# Patient Record
Sex: Male | Born: 1960
Health system: Southern US, Community
[De-identification: ages and names within clinical notes are randomized; demographics above are authoritative.]

## PROBLEM LIST (undated history)

## (undated) DIAGNOSIS — E785 Hyperlipidemia, unspecified: Secondary | ICD-10-CM

## (undated) DIAGNOSIS — T7840XA Allergy, unspecified, initial encounter: Secondary | ICD-10-CM

## (undated) DIAGNOSIS — G4730 Sleep apnea, unspecified: Secondary | ICD-10-CM

## (undated) DIAGNOSIS — R0989 Other specified symptoms and signs involving the circulatory and respiratory systems: Secondary | ICD-10-CM

## (undated) DIAGNOSIS — R0683 Snoring: Secondary | ICD-10-CM

## (undated) HISTORY — DX: Sleep apnea, unspecified: G47.30

## (undated) HISTORY — DX: Hyperlipidemia, unspecified: E78.5

## (undated) HISTORY — DX: Other specified symptoms and signs involving the circulatory and respiratory systems: R09.89

## (undated) HISTORY — PX: NO PAST SURGERIES: SHX2092

## (undated) HISTORY — DX: Snoring: R06.83

## (undated) HISTORY — DX: Allergy, unspecified, initial encounter: T78.40XA

## (undated) HISTORY — PX: OTHER SURGICAL HISTORY: SHX169

---

## 2011-09-29 ENCOUNTER — Telehealth: Payer: Self-pay

## 2011-09-29 NOTE — Telephone Encounter (Signed)
LMOM of cell to CB with pharmacy and medication info.

## 2011-09-29 NOTE — Telephone Encounter (Signed)
Spoke with patients wife, needing allergy med and nasal spray called in.  Last seen 01/2011 and chart not filed back.  Wife is unsure of spray, but he was using Allegra.  Called [facility] to find out, and both the one in [place] and [place] show no record of these meds.  Told to call [facility], and they have no allergy meds on pt.  Can we refill Allegra, and ? Some nasal spray to pharmacy?  Pls advise.  Contact wife at [phone] and send into [facility]

## 2011-09-29 NOTE — Telephone Encounter (Signed)
.  [facility] Patient called to state that he had called Wednesday 09/26/11 regarding his nasal spray refill and that he checked this morning and the pharmacy did not have his Rx ready.  Patient states he was told on 09/26/11 that it would take 24-48 hours for his Rx refill.  Please call patient at [phone]-Patient stated that it was ok for [facility] staff to speak with his wife Gunnar Fusi regarding his medical information.

## 2011-09-30 MED ORDER — AZELASTINE HCL 0.1 % NA SOLN: 2.0000 | Freq: Two times a day (BID) | NASAL | Status: DC

## 2011-09-30 MED ORDER — FEXOFENADINE HCL 180 MG PO TABS: 180.0000 mg | ORAL_TABLET | Freq: Every day | ORAL | Status: DC

## 2011-09-30 MED ORDER — FLUTICASONE PROPIONATE 50 MCG/ACT NA SUSP: 2.0000 | Freq: Every day | NASAL | Status: DC

## 2011-09-30 NOTE — Telephone Encounter (Signed)
Patients wife notified

## 2011-09-30 NOTE — Telephone Encounter (Signed)
Allegra, Astelin and Flonase sent in to pharm.

## 2012-11-04 ENCOUNTER — Telehealth: Payer: Self-pay

## 2012-11-04 NOTE — Telephone Encounter (Signed)
Needs OV, pt has not been seen in epic

## 2012-11-04 NOTE — Telephone Encounter (Signed)
Pt called stating his pharmacy faxed over a request for a refill on his nasal spray, therefore, wanted to know if it has been received. Pt req a call back at [phone].

## 2012-11-04 NOTE — Telephone Encounter (Signed)
This wasn't sent in, can we send it in. Please advise.

## 2012-11-05 ENCOUNTER — Telehealth: Payer: Self-pay | Admitting: Radiology

## 2012-11-05 NOTE — Telephone Encounter (Signed)
Patient advised he needs office visit for med refills. He plans to come in. Last OV 2012

## 2012-11-05 NOTE — Telephone Encounter (Signed)
Left message for patient to come in for OV before getting refills.

## 2013-03-11 ENCOUNTER — Ambulatory Visit (INDEPENDENT_AMBULATORY_CARE_PROVIDER_SITE_OTHER): Payer: BC Managed Care – PPO | Admitting: Family Medicine

## 2013-03-11 VITALS — BP 110/74 | HR 73 | Temp 98.2°F | Resp 18 | Ht 68.0 in | Wt 193.0 lb

## 2013-03-11 DIAGNOSIS — J309 Allergic rhinitis, unspecified: Secondary | ICD-10-CM

## 2013-03-11 DIAGNOSIS — Z0000 Encounter for general adult medical examination without abnormal findings: Secondary | ICD-10-CM

## 2013-03-11 DIAGNOSIS — Z125 Encounter for screening for malignant neoplasm of prostate: Secondary | ICD-10-CM

## 2013-03-11 LAB — POCT CBC
Hemoglobin: 12.8 g/dL — AB (ref 14.1–18.1)
MCH, POC: 29 pg (ref 27–31.2)
MCV: 92.5 fL (ref 80–97)
RBC: 4.42 M/uL — AB (ref 4.69–6.13)
WBC: 7.7 10*3/uL (ref 4.6–10.2)

## 2013-03-11 LAB — COMPREHENSIVE METABOLIC PANEL
ALT: 24 U/L (ref 0–53)
Albumin: 4.8 g/dL (ref 3.5–5.2)
BUN: 16 mg/dL (ref 6–23)
CO2: 23 mEq/L (ref 19–32)
Calcium: 10.1 mg/dL (ref 8.4–10.5)
Chloride: 104 mEq/L (ref 96–112)
Creat: 0.85 mg/dL (ref 0.50–1.35)
Potassium: 4.1 mEq/L (ref 3.5–5.3)

## 2013-03-11 LAB — PSA: PSA: 0.27 ng/mL (ref <=4.00)

## 2013-03-11 LAB — LIPID PANEL
HDL: 57 mg/dL (ref >39)
Triglycerides: 63 mg/dL (ref <150)

## 2013-03-11 MED ORDER — AZELASTINE HCL 0.1 % NA SOLN: 1.0000 | Freq: Two times a day (BID) | NASAL | Status: DC

## 2013-03-11 NOTE — Progress Notes (Signed)
[facility] [address] [place] [address] [phone]  Date:  03/11/2013   Name:  Earl Ruiz   DOB:  Feb 13, 1961   MRN:  [id number]  PCP:  No primary provider on file.    Chief Complaint: Annual Exam   History of Present Illness:  Earl Ruiz is a 52 y.o. very pleasant male patient who presents with the following:  He is here today as a new pt or a CPE and health screening for his job.  He needs a colonoscopy as this has not been done yet.   He did have a PCP here, but we have not seen him in a few years.  He is fasting this morning.  He does have a history of AR and needs a refill of his astelin spray.    He did have a tetanus shot about one year ago through his job.  He will receive his flu shot at his job.    He is married, no children, does not smoke or drink alcohol.     There are no active problems to display for this patient.   Past Medical History  Diagnosis Date  . Allergy     History reviewed. No pertinent past surgical history.  History  Substance Use Topics  . Smoking status: Never Smoker   . Smokeless tobacco: Not on file  . Alcohol Use: No    History reviewed. No pertinent family history.  No Known Allergies  Medication list has been reviewed and updated.  Current Outpatient Prescriptions on File Prior to Visit  Medication Sig Dispense Refill  . azelastine (ASTELIN) 137 MCG/SPRAY nasal spray Place 2 sprays into the nose 2 (two) times daily. Use in each nostril as directed  30 mL  2   No current facility-administered medications on file prior to visit.    Review of Systems:  As per HPI- otherwise negative.   Physical Examination: Filed Vitals:   03/11/13 0910  BP: 110/74  Pulse: 73  Temp: 98.2 F (36.8 C)  Resp: 18   Filed Vitals:   03/11/13 0910  Height: 5\' 8"  (1.727 m)  Weight: 193 lb (87.544 kg)   Body mass index is 29.35 kg/(m^2). Ideal Body Weight: Weight in (lb) to have BMI = 25:  164.1  GEN: WDWN, NAD, Non-toxic, A & O x 3 HEENT: Atraumatic, Normocephalic. Neck supple. No masses, No LAD.  Bilateral TM wnl, oropharynx normal.  PEERL,EOMI.   Ears and Nose: No external deformity. CV: RRR, No M/G/R. No JVD. No thrill. No extra heart sounds. PULM: CTA B, no wheezes, crackles, rhonchi. No retractions. No resp. distress. No accessory muscle use. ABD: S, NT, ND, +BS. No rebound. No HSM. EXTR: No c/c/e NEURO Normal gait.  PSYCH: Normally interactive. Conversant. Not depressed or anxious appearing.  Calm demeanor.  Gu: normal testes and penis, normal prostate   Results for orders placed in visit on 03/11/13  POCT CBC      Result Value Range   WBC 7.7  4.6 - 10.2 K/uL   Lymph, poc 2.1  0.6 - 3.4   POC LYMPH PERCENT 26.9  10 - 50 %L   MID (cbc) 0.5  0 - 0.9   POC MID % 7.1  0 - 12 %M   POC Granulocyte 5.1  2 - 6.9   Granulocyte percent 66.0  37 - 80 %G   RBC 4.42 (*) 4.69 - 6.13 M/uL   Hemoglobin 12.8 (*) 14.1 - 18.1  g/dL   HCT, POC 40.9 (*) 81.1 - 53.7 %   MCV 92.5  80 - 97 fL   MCH, POC 29.0  27 - 31.2 pg   MCHC 31.3 (*) 31.8 - 35.4 g/dL   RDW, POC 91.4     Platelet Count, POC 349  142 - 424 K/uL   MPV 8.0  0 - 99.8 fL    Assessment and Plan: Physical exam - Plan: POCT CBC, Comprehensive metabolic panel, Lipid panel  Allergic rhinitis - Plan: azelastine (ASTELIN) 137 MCG/SPRAY nasal spray  Screening for prostate cancer - Plan: PSA  Normal PE except he is slightly anemic today.  Encouraged to schedule his colonoscopy asap.   He receives his annual flu shot at work.   Will plan further follow- up pending labs.   Signed Abbe Amsterdam, MD

## 2013-03-11 NOTE — Patient Instructions (Addendum)
I will be in touch with the rest of your labs so you can include your cholesterol, etc on your health form  Remember to get your flu shot this year!   Go ahead and call the gastroenterologist of your choice ([facility]) and ask to schedule a screening colonoscopy.  This is especially important because you are slightly anemic.

## 2013-03-12 ENCOUNTER — Encounter: Payer: Self-pay | Admitting: Family Medicine

## 2013-03-19 ENCOUNTER — Telehealth: Payer: Self-pay

## 2013-03-19 DIAGNOSIS — Z1211 Encounter for screening for malignant neoplasm of colon: Secondary | ICD-10-CM

## 2013-03-19 DIAGNOSIS — E7800 Pure hypercholesterolemia, unspecified: Secondary | ICD-10-CM

## 2013-03-19 NOTE — Telephone Encounter (Signed)
Called and discussed with him.  He is concerned about his cholesterol.  He would like to give lifestyle changes a try prior to going on medication (it sounds like he was on lipitor at some point in the past).  He will work on diet, exercise and weight loss and will come by for a lab visit only in 2 or 3 months,

## 2013-03-19 NOTE — Telephone Encounter (Signed)
Patient states that he needs the numbers for his glucose levels in order to complete his PE form on MyChart. Also states he needs a referral for a colonoscopy. Patient also states his cholesterol is high; would like medication for that.  [phone].

## 2013-03-19 NOTE — Telephone Encounter (Signed)
Glucose 94. Where is he having the Colonoscopy? He wants referral. Sent referral to [facility] for GI appt, since he wants [place] to do this. He wants to know if you want him to start on medication, he has not gotten the letter you sent yet, he is agreeable to start meds if you want him to. Please advise

## 2013-03-26 ENCOUNTER — Telehealth: Payer: Self-pay | Admitting: Radiology

## 2013-03-26 NOTE — Telephone Encounter (Signed)
Patient calling about colonoscopy. Referral was sent to [facility]. [phone] left message for him to advise it was sent and he can call and check on this.

## 2013-03-30 ENCOUNTER — Encounter: Payer: Self-pay | Admitting: Internal Medicine

## 2013-05-21 ENCOUNTER — Other Ambulatory Visit: Payer: Self-pay

## 2013-05-26 ENCOUNTER — Ambulatory Visit (AMBULATORY_SURGERY_CENTER): Payer: Self-pay | Admitting: *Deleted

## 2013-05-26 VITALS — Ht 68.0 in | Wt 199.6 lb

## 2013-05-26 DIAGNOSIS — Z1211 Encounter for screening for malignant neoplasm of colon: Secondary | ICD-10-CM

## 2013-05-26 MED ORDER — MOVIPREP 100 G PO SOLR: ORAL | Status: DC

## 2013-05-26 NOTE — Progress Notes (Signed)
No allergies to eggs or soy. No problems with anesthesia.  

## 2013-05-29 ENCOUNTER — Encounter: Payer: Self-pay | Admitting: Internal Medicine

## 2013-06-09 ENCOUNTER — Encounter: Payer: Self-pay | Admitting: Internal Medicine

## 2013-06-09 ENCOUNTER — Ambulatory Visit (AMBULATORY_SURGERY_CENTER): Payer: BC Managed Care – PPO | Admitting: Internal Medicine

## 2013-06-09 VITALS — BP 138/82 | HR 67 | Temp 97.9°F | Resp 25 | Ht 68.0 in | Wt 199.0 lb

## 2013-06-09 DIAGNOSIS — D126 Benign neoplasm of colon, unspecified: Secondary | ICD-10-CM

## 2013-06-09 DIAGNOSIS — Z1211 Encounter for screening for malignant neoplasm of colon: Secondary | ICD-10-CM

## 2013-06-09 MED ORDER — SODIUM CHLORIDE 0.9 % IV SOLN: 500.0000 mL | INTRAVENOUS | Status: DC

## 2013-06-09 NOTE — Progress Notes (Signed)
Called to room to assist during endoscopic procedure.  Patient ID and intended procedure confirmed with present staff. Received instructions for my participation in the procedure from the performing physician.  

## 2013-06-09 NOTE — Op Note (Signed)
[facility] [address] [place], [address]   COLONOSCOPY PROCEDURE REPORT  PATIENT: Earl Ruiz, Earl Ruiz  MR#: [id number] BIRTHDATE: 08/27/1960 , 52  yrs. old GENDER: Male ENDOSCOPIST: Beverley Fiedler, MD REFERRED QI:ONGEXBM Copland, M.D. PROCEDURE DATE:  06/09/2013 PROCEDURE:   Colonoscopy with snare polypectomy First Screening Colonoscopy - Avg.  risk and is 50 yrs.  old or older Yes.  Prior Negative Screening - Now for repeat screening. N/A  History of Adenoma - Now for follow-up colonoscopy & has been > or = to 3 yrs.  N/A  Polyps Removed Today? Yes. ASA CLASS:   Class I INDICATIONS:average risk screening and first colonoscopy. MEDICATIONS: MAC sedation, administered by CRNA and propofol (Diprivan) 300mg  IV  DESCRIPTION OF PROCEDURE:   After the risks benefits and alternatives of the procedure were thoroughly explained, informed consent was obtained.  A digital rectal exam revealed no rectal mass.   The LB [id number]  endoscope was introduced through the anus and advanced to the cecum, which was identified by both the appendix and ileocecal valve. No adverse events experienced. The quality of the prep was good, using MoviPrep  The instrument was then slowly withdrawn as the colon was fully examined.  COLON FINDINGS: Two sessile polyps measuring 5 and 2 mm in size were found in the sigmoid colon and rectum.  Polypectomy was performed using cold snare.  All resections were complete and all polyp tissue was completely retrieved.   Mild diverticulosis was noted in the sigmoid colon.  Retroflexed views revealed small external hemorrhoids. The time to cecum=1 minutes 44 seconds.  Withdrawal time=12 minutes 53 seconds.  The scope was withdrawn and the procedure completed. COMPLICATIONS: There were no complications.  ENDOSCOPIC IMPRESSION: 1.   Two sessile polyps measuring 5 and 2 mm in size were found in the sigmoid colon and rectum; Polypectomy was performed  using cold snare 2.   Mild diverticulosis was noted in the sigmoid colon  RECOMMENDATIONS: 1.  Await pathology results 2.  High fiber diet 3.  If the polyps removed today are proven to be adenomatous (pre-cancerous) polyps, you will need a repeat colonoscopy in 5 years.  Otherwise you should continue to follow colorectal cancer screening guidelines for "routine risk" patients with colonoscopy in 10 years.  You will receive a letter within 1-2 weeks with the results of your biopsy as well as final recommendations.  Please call my office if you have not received a letter after 3 weeks.   eSigned:  Beverley Fiedler, MD 06/09/2013 9:58 AM cc: The Patient and Abbe Amsterdam, MD

## 2013-06-09 NOTE — Patient Instructions (Addendum)
YOU HAD AN ENDOSCOPIC PROCEDURE TODAY AT THE [facility]: Refer to the procedure report that was given to you for any specific questions about what was found during the examination.  If the procedure report does not answer your questions, please call your gastroenterologist to clarify.  If you requested that your care partner not be given the details of your procedure findings, then the procedure report has been included in a sealed envelope for you to review at your convenience later.  YOU SHOULD EXPECT: Some feelings of bloating in the abdomen. Passage of more gas than usual.  Walking can help get rid of the air that was put into your GI tract during the procedure and reduce the bloating. If you had a lower endoscopy (such as a colonoscopy or flexible sigmoidoscopy) you may notice spotting of blood in your stool or on the toilet paper. If you underwent a bowel prep for your procedure, then you may not have a normal bowel movement for a few days.  DIET: Your first meal following the procedure should be a light meal and then it is ok to progress to your normal diet.  A half-sandwich or bowl of soup is an example of a good first meal.  Heavy or fried foods are harder to digest and may make you feel nauseous or bloated.  Likewise meals heavy in dairy and vegetables can cause extra gas to form and this can also increase the bloating.  Drink plenty of fluids but you should avoid alcoholic beverages for 24 hours.  ACTIVITY: Your care partner should take you home directly after the procedure.  You should plan to take it easy, moving slowly for the rest of the day.  You can resume normal activity the day after the procedure however you should NOT DRIVE or use heavy machinery for 24 hours (because of the sedation medicines used during the test).    SYMPTOMS TO REPORT IMMEDIATELY: A gastroenterologist can be reached at any hour.  During normal business hours, 8:30 AM to 5:00 PM Monday through Friday,  call [phone].  After hours and on weekends, please call the GI answering service at [phone] who will take a message and have the physician on call contact you.   Following lower endoscopy (colonoscopy or flexible sigmoidoscopy):  Excessive amounts of blood in the stool  Significant tenderness or worsening of abdominal pains  Swelling of the abdomen that is new, acute  Fever of 100F or higher    FOLLOW UP: If any biopsies were taken you will be contacted by phone or by letter within the next 1-3 weeks.  Call your gastroenterologist if you have not heard about the biopsies in 3 weeks.  Our staff will call the home number listed on your records the next business day following your procedure to check on you and address any questions or concerns that you may have at that time regarding the information given to you following your procedure. This is a courtesy call and so if there is no answer at the home number and we have not heard from you through the emergency physician on call, we will assume that you have returned to your regular daily activities without incident.  SIGNATURES/CONFIDENTIALITY: You and/or your care partner have signed paperwork which will be entered into your electronic medical record.  These signatures attest to the fact that that the information above on your After Visit Summary has been reviewed and is understood.  Full responsibility of the confidentiality   of this discharge information lies with you and/or your care-partner.    INFORMATION ON DIVERTICULOSIS POLYPS & HEMORRHOIDS GIVEN TO YOU TODAY

## 2013-06-09 NOTE — Progress Notes (Signed)
No egg or soy allergy. ewm Pt with no prior surgeries or sedation. ewm

## 2013-06-10 ENCOUNTER — Telehealth: Payer: Self-pay | Admitting: *Deleted

## 2013-06-10 NOTE — Telephone Encounter (Signed)
  Follow up Call-  Call back number 06/09/2013  Post procedure Call Back phone  # [phone]  Permission to leave phone message Yes     Patient questions:  Do you have a fever, pain , or abdominal swelling? no Pain Score  0 *  Have you tolerated food without any problems? yes  Have you been able to return to your normal activities? yes  Do you have any questions about your discharge instructions: Diet   no Medications  no Follow up visit  no  Do you have questions or concerns about your Care? no  Actions: * If pain score is 4 or above: No action needed, pain <4.

## 2013-06-16 ENCOUNTER — Encounter: Payer: Self-pay | Admitting: Internal Medicine

## 2013-07-27 ENCOUNTER — Telehealth: Payer: Self-pay | Admitting: Internal Medicine

## 2013-07-27 NOTE — Telephone Encounter (Signed)
Contacted by patient's medical insurance regarding a claim for the colonoscopy performed on 06/09/2013, specifically the anesthesia charge associated with colonoscopy The question was raised as to why he was sedated with propofol   I explained that the patient was sent direct to colonoscopy by his primary care provider for screening colonoscopy He was sedated with propofol as is my first line sedation medication and standard of care in our outpatient endoscopy center.  All patient's for endoscopic procedure receive propofol unless there is an allergy or other contraindication The documentation for this procedure was completed on 06/09/2013 and is available for review if necessary

## 2014-04-12 ENCOUNTER — Ambulatory Visit (INDEPENDENT_AMBULATORY_CARE_PROVIDER_SITE_OTHER): Payer: BC Managed Care – PPO | Admitting: Family Medicine

## 2014-04-12 VITALS — BP 128/90 | HR 73 | Temp 98.2°F | Resp 16 | Ht 67.5 in | Wt 203.8 lb

## 2014-04-12 DIAGNOSIS — Z130 Encounter for screening for diseases of the blood and blood-forming organs and certain disorders involving the immune mechanism: Secondary | ICD-10-CM

## 2014-04-12 DIAGNOSIS — J3089 Other allergic rhinitis: Secondary | ICD-10-CM

## 2014-04-12 DIAGNOSIS — Z23 Encounter for immunization: Secondary | ICD-10-CM

## 2014-04-12 DIAGNOSIS — Z13220 Encounter for screening for lipoid disorders: Secondary | ICD-10-CM

## 2014-04-12 DIAGNOSIS — Z131 Encounter for screening for diabetes mellitus: Secondary | ICD-10-CM

## 2014-04-12 DIAGNOSIS — Z125 Encounter for screening for malignant neoplasm of prostate: Secondary | ICD-10-CM

## 2014-04-12 DIAGNOSIS — G4733 Obstructive sleep apnea (adult) (pediatric): Secondary | ICD-10-CM

## 2014-04-12 MED ORDER — IPRATROPIUM BROMIDE 0.03 % NA SOLN: 2.0000 | Freq: Four times a day (QID) | NASAL | Status: DC

## 2014-04-12 MED ORDER — AZELASTINE HCL 0.1 % NA SOLN: 1.0000 | Freq: Two times a day (BID) | NASAL | Status: DC

## 2014-04-12 NOTE — Progress Notes (Signed)
[facility] [address], [place] [address] [phone]  Date:  04/12/2014   Name:  Earl Ruiz   DOB:  [birth year]   MRN:  [id number]  PCP:  Lamar Blinks, MD    Chief Complaint: Annual Exam   History of Present Illness:  Earl Ruiz is a 53 y.o. very pleasant male patient who presents with the following:  He is here today for a CPE.  He is generally quite healthy and active.   He would like a flu shot today.   He has noted a couple of episodes where he would wake up suddenly and felt like he was SOB.  This does not happen a lot but he is concerned that he might have OSA.  His wife has noted some snoring as well.    He does not have any CP or have any SOB . He does exercise 2x a week, and plans to increase to 4x a week.  He is doing "insanity" work- outs.   He is fasting today for labs.    tdap 8 years ago- he would like to have a booster today as he did cut his finger a couple of weeks ago  Also desires a Flu shot today No issues with prostate enlargement or glaucoma  There are no active problems to display for this patient.   Past Medical History  Diagnosis Date  . Allergy     Past Surgical History  Procedure Laterality Date  . No prior surgery      History  Substance Use Topics  . Smoking status: Never Smoker   . Smokeless tobacco: Never Used  . Alcohol Use: No    Family History  Problem Relation Age of Onset  . Colon cancer Neg Hx   . Diabetes Father     No Known Allergies  Medication list has been reviewed and updated.  Current Outpatient Prescriptions on File Prior to Visit  Medication Sig Dispense Refill  . azelastine (ASTELIN) 137 MCG/SPRAY nasal spray Place 1 spray into the nose 2 (two) times daily. Use in each nostril as directed  30 mL  12   No current facility-administered medications on file prior to visit.    Review of Systems:  As per HPI- otherwise negative.   Physical Examination: Filed Vitals:   04/12/14 1351  BP: 128/90  Pulse: 73  Temp: 98.2 F (36.8 C)  Resp: 16   Filed Vitals:   04/12/14 1351  Height: 5' 7.5" (1.715 m)  Weight: 203 lb 12.8 oz (92.443 kg)   Body mass index is 31.43 kg/(m^2). Ideal Body Weight: Weight in (lb) to have BMI = 25: 161.7  GEN: WDWN, NAD, Non-toxic, A & O x 3, looks well, fit build HEENT: Atraumatic, Normocephalic. Neck supple. No masses, No LAD.  Bilateral TM wnl, oropharynx normal.  PEERL,EOMI.   Ears and Nose: No external deformity. CV: RRR, No M/G/R. No JVD. No thrill. No extra heart sounds. PULM: CTA B, no wheezes, crackles, rhonchi. No retractions. No resp. distress. No accessory muscle use. ABD: S, NT, ND. No rebound. No HSM. EXTR: No c/c/e NEURO Normal gait.  PSYCH: Normally interactive. Conversant. Not depressed or anxious appearing.  Calm demeanor.  GU: normal scrotal/ testicular/ penile exam, normal DRE  Assessment and Plan: Need for prophylactic vaccination and inoculation against influenza - Plan: Flu Vaccine QUAD 36+ mos IM, Td vaccine greater than or equal to 7yo preservative free IM  Screening for prostate cancer - Plan: PSA  Sleep apnea, obstructive - Plan: Nocturnal polysomnography (NPSG), ipratropium (ATROVENT) 0.03 % nasal spray  Screening for diabetes mellitus - Plan: Comprehensive metabolic panel  Screening for hyperlipidemia - Plan: Lipid panel  Screening for deficiency anemia - Plan: CBC  Generally well man here today for a CPE.  Immunizations as above, await labs  Will refer for a sleep study as he is concerned about OSA   Signed Lamar Blinks, MD

## 2014-04-12 NOTE — Patient Instructions (Signed)
We will set you up for a sleep study to make sure you do not have sleep apnea. You can use the atrovent nasal spray for mucus/ Post- nasal drip as needed.    I will be in touch with your labs

## 2014-04-13 ENCOUNTER — Encounter: Payer: Self-pay | Admitting: Family Medicine

## 2014-04-13 LAB — COMPREHENSIVE METABOLIC PANEL
ALT: 21 U/L (ref 0–53)
AST: 18 U/L (ref 0–37)
Albumin: 4.7 g/dL (ref 3.5–5.2)
Alkaline Phosphatase: 68 U/L (ref 39–117)
BILIRUBIN TOTAL: 0.5 mg/dL (ref 0.2–1.2)
BUN: 9 mg/dL (ref 6–23)
CALCIUM: 9.7 mg/dL (ref 8.4–10.5)
CHLORIDE: 101 meq/L (ref 96–112)
CO2: 27 mEq/L (ref 19–32)
CREATININE: 0.81 mg/dL (ref 0.50–1.35)
Glucose, Bld: 87 mg/dL (ref 70–99)
Potassium: 4.1 mEq/L (ref 3.5–5.3)
Sodium: 137 mEq/L (ref 135–145)
Total Protein: 7.9 g/dL (ref 6.0–8.3)

## 2014-04-13 LAB — CBC
HCT: 41.8 % (ref 39.0–52.0)
Hemoglobin: 13.7 g/dL (ref 13.0–17.0)
MCH: 28.8 pg (ref 26.0–34.0)
MCHC: 32.8 g/dL (ref 30.0–36.0)
MCV: 88 fL (ref 78.0–100.0)
Platelets: 392 10*3/uL (ref 150–400)
RBC: 4.75 MIL/uL (ref 4.22–5.81)
RDW: 13.6 % (ref 11.5–15.5)
WBC: 10.4 10*3/uL (ref 4.0–10.5)

## 2014-04-13 LAB — LIPID PANEL
CHOL/HDL RATIO: 4.5 ratio
CHOLESTEROL: 217 mg/dL — AB (ref 0–200)
HDL: 48 mg/dL (ref >39)
LDL Cholesterol: 152 mg/dL — ABNORMAL HIGH (ref 0–99)
Triglycerides: 84 mg/dL (ref <150)
VLDL: 17 mg/dL (ref 0–40)

## 2014-04-13 LAB — PSA: PSA: 0.3 ng/mL (ref <=4.00)

## 2014-04-14 ENCOUNTER — Other Ambulatory Visit: Payer: Self-pay | Admitting: Radiology

## 2014-04-14 DIAGNOSIS — G4733 Obstructive sleep apnea (adult) (pediatric): Secondary | ICD-10-CM

## 2014-04-14 NOTE — Addendum Note (Signed)
Addended byCandice Camp on: 04/14/2014 10:52 AM   Modules accepted: Orders

## 2014-05-05 ENCOUNTER — Institutional Professional Consult (permissible substitution): Payer: BC Managed Care – PPO | Admitting: Neurology

## 2014-05-10 ENCOUNTER — Ambulatory Visit (INDEPENDENT_AMBULATORY_CARE_PROVIDER_SITE_OTHER): Payer: BC Managed Care – PPO | Admitting: Neurology

## 2014-05-10 ENCOUNTER — Encounter: Payer: Self-pay | Admitting: Neurology

## 2014-05-10 VITALS — BP 128/89 | HR 84 | Temp 97.2°F | Resp 14 | Ht 68.0 in | Wt 207.0 lb

## 2014-05-10 DIAGNOSIS — J302 Other seasonal allergic rhinitis: Secondary | ICD-10-CM

## 2014-05-10 DIAGNOSIS — R0989 Other specified symptoms and signs involving the circulatory and respiratory systems: Secondary | ICD-10-CM

## 2014-05-10 DIAGNOSIS — R0689 Other abnormalities of breathing: Secondary | ICD-10-CM

## 2014-05-10 DIAGNOSIS — G4730 Sleep apnea, unspecified: Secondary | ICD-10-CM

## 2014-05-10 DIAGNOSIS — R0683 Snoring: Secondary | ICD-10-CM

## 2014-05-10 HISTORY — DX: Other specified symptoms and signs involving the circulatory and respiratory systems: R09.89

## 2014-05-10 HISTORY — DX: Sleep apnea, unspecified: G47.30

## 2014-05-10 HISTORY — DX: Snoring: R06.83

## 2014-05-10 MED ORDER — FEXOFENADINE HCL 30 MG PO TABS
30.0000 mg | ORAL_TABLET | Freq: Two times a day (BID) | ORAL | Status: DC
Start: 2014-05-10 — End: 2018-09-03

## 2014-05-10 MED ORDER — FLUTICASONE PROPIONATE 50 MCG/ACT NA SUSP: 2.0000 | Freq: Every day | NASAL | Status: DC

## 2014-05-10 NOTE — Progress Notes (Signed)
SLEEP MEDICINE CLINIC   Provider:  Larey Seat, M D  Referring Provider: Darreld Mclean, MD Primary Care Physician:  Lamar Blinks, MD  Chief Complaint  Patient presents with  . NP Sleep    Rm 11, wife    HPI:  Earl Ruiz is a 53 y.o. male , who is seen here as a referral from Dr. Lorelei Pont for a sleep consultation,  [name] reports that he is snoring at night and his wife has witnessed this, and that he feels sometimes short of breath while sleeping he describes his sleep choking sensation. His wife also has witnessed him gasping for breath.  About 2 years ago he changed a lot of his diet, he was successful in losing weight and controlling his cholesterolemia and he  regularly works out.  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    He reports neither sleepiness, nor fatigue. He goes to bed at 10.30  PM and sleeps soon after, sleeps through the night,  unless he has choking or louder snoring episodes. He has a one time nocturia, sometimes 2. His wife reports he snores on his back and on his side. Not every night. He rises 4 days a week before 7 AM, he wakes with an alarm.  On weekends 3 AM , as he works another second job on weekends. He rarely dreams.   The bedroom is quiet and dark, and cool. He has allergic seasonal rhinitis. He has no known neck injuries, facial injuries or ENT surgery.    Review of Systems: Out of a complete 14 system review, the patient complains of only the following symptoms, and all other reviewed systems are negative. Sleep choking episodes, snoring ,    Epworth score 8 , Fatigue severity score 21  , depression score n/a , will need PHQ 9 in lab.    History   Social History  . Marital Status: Unknown    Spouse Name: N/A    Number of Children: N/A  . Years of Education: N/A   Occupational History  . Not on file.   Social History Main Topics  . Smoking status: Never Smoker   . Smokeless  tobacco: Never Used  . Alcohol Use: No     Comment: occ  . Drug Use: No  . Sexual Activity: Not on file   Other Topics Concern  . Not on file   Social History Narrative   Right handed.  Caffeine 2 cups daily, Married, no kids.  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     Family History  Problem Relation Age of Onset  . Colon cancer Neg Hx   . Diabetes Father     Past Medical History  Diagnosis Date  . Allergy     Past Surgical History  Procedure Laterality Date  . No prior surgery      Current Outpatient Prescriptions  Medication Sig Dispense Refill  . azelastine (ASTELIN) 0.1 % nasal spray Place 1 spray into both nostrils 2 (two) times daily. Use in each nostril as directed  30 mL  12  . ipratropium (ATROVENT) 0.03 % nasal spray Place 2 sprays into the nose 4 (four) times daily.  30 mL  6  . fexofenadine (ALLEGRA) 30 MG tablet Take 1 tablet (30 mg total) by mouth 2 (two) times daily.  60 tablet  3  . fluticasone (FLONASE) 50 MCG/ACT nasal spray Place 2 sprays into both nostrils daily.  16 g  2   No current facility-administered medications for this visit.    Allergies as of 05/10/2014  . (No Known Allergies)    Vitals: BP 128/89  Pulse 84  Temp(Src) 97.2 F (36.2 C) (Oral)  Resp 14  Ht 5\' 8"  (1.727 m)  Wt 207 lb (93.895 kg)  BMI 31.48 kg/m2 Last Weight:  Wt Readings from Last 1 Encounters:  05/10/14 207 lb (93.895 kg)       Last Height:   Ht Readings from Last 1 Encounters:  05/10/14 5\' 8"  (1.727 m)    Physical exam:  General: The patient is awake, alert and appears not in acute distress. The patient is well groomed. Head: Normocephalic, atraumatic. Neck is supple. Mallampati 4 - uvula not visible. ,  neck circumference: 16.25 . Nasal airflow restricted , TMJ is not evident . Retrognathia is not seen.  Cardiovascular:  Regular rate and rhythm , without  murmurs or carotid bruit, and without distended neck veins. Respiratory: Lungs are clear to auscultation. Skin:   Without evidence of edema, or rash Trunk:  patient has normal posture. Neurologic exam : The patient is awake and alert, oriented to place and time.   Memory subjective described as intact. There is a normal attention span & concentration ability.  Speech is fluent without dysarthria, dysphonia or aphasia. Mood and affect are appropriate. Cranial nerves: Pupils are equal and briskly reactive to light. Funduscopic exam without evidence of pallor or edema.  Extraocular movements  in vertical and horizontal planes intact and without nystagmus. Visual fields by finger perimetry are intact. Hearing to finger rub intact.  Facial sensation intact to fine touch. Facial motor strength is symmetric and tongue and uvula move midline. Motor exam: Normal tone, muscle bulk and symmetric strength in all extremities. Sensory:  Fine touch, pinprick and vibration were tested in all extremities. Proprioception is tested in the upper extremities only.  This was normal. Coordination: Rapid alternating movements in the fingers/hands is normal. Finger-to-nose maneuver  normal without evidence of ataxia, dysmetria or tremor. Gait and station: Patient walks without assistive device and is able unassisted to climb up to the exam table. Strength within normal limits. Stance is stable and normal. Deep tendon reflexes: in the  upper and lower extremities are symmetric and intact. Babinski maneuver response is downgoing.  Assessment:  After physical and neurologic examination, review of laboratory studies, imaging, neurophysiology testing and pre-existing records, assessment is:  1)   This patient has few risk d factors for OSA but his witnessed choking and crescendo snoring make a formal event necessary.  2)   Allergic rhinitis seasonal. Daly fexofenadine in allergy season and nasocort when needed.   Plan:  Treatment plan and additional workup : Ordered a SPLIT at 3 % , AHI 10,    The patient was advised of the nature  of the diagnosed sleep disorder , the treatment options and risks for general a health and wellness arising from not treating the condition. Visit duration was 30  minutes.        Asencion Partridge Natallia Stellmach MD  05/10/2014

## 2014-05-10 NOTE — Patient Instructions (Signed)
Polysomnography (Sleep Studies) Polysomnography (PSG) is a series of tests used for detecting (diagnosing) obstructive sleep apnea and other sleep disorders. The tests measure how some parts of your body are working while you are sleeping. The tests are extensive and expensive. They are done in a sleep lab or hospital, and vary from center to center. Your caregiver may perform other more simple sleep studies and questionnaires before doing more complete and involved testing. Testing may not be covered by insurance. Some of these tests are:  An EEG (Electroencephalogram). This tests your brain waves and stages of sleep.  An EOG (Electrooculogram). This measures the movements of your eyes. It detects periods of REM (rapid eye movement) sleep, which is your dream sleep.  An EKG (Electrocardiogram). This measures your heart rhythm.  EMG (Electromyography). This is a measurement of how the muscles are working in your upper airway and your legs while sleeping.  An oximetry measurement. It measures how much oxygen (air) you are getting while sleeping.  Breathing efforts may be measured. The same test can be interpreted (understood) differently by different caregivers and centers that study sleep.  Studies may be given an apnea/hypopnea index (AHI). This is a number which is found by counting the times of no breathing or under breathing during the night, and relating those numbers to the amount of time spent in bed. When the AHI is greater than 15, the patient is likely to complain of daytime sleepiness. When the AHI is greater than 30, the patient is at increased risk for heart problems and must be followed more closely. Following the AHI also allows you to know how treatment is working. Simple oximetry (tracking the amount of oxygen that is taken in) can be used for screening patients who:  Do not have symptoms (problems) of OSA.  Have a normal Epworth Sleepiness Scale Score.  Have a low pre-test  probability of having OSA.  Have none of the upper airway problems likely to cause apnea.  Oximetry is also used to determine if treatment is effective in patients who showed significant desaturations (not getting enough oxygen) on their home sleep study. One extra measure of safety is to perform additional studies for the person who only snores. This is because no one can predict with absolute certainty who will have OSA. Those who show significant desaturations (not getting enough oxygen) are recommended to have a more detailed sleep study. Document Released: 01/06/2003 Document Revised: 09/24/2011 Document Reviewed: 09/07/2013 ExitCare Patient Information 2015 [employer]. This information is not intended to replace advice given to you by your health care provider. Make sure you discuss any questions you have with your health care provider.  

## 2014-06-07 ENCOUNTER — Other Ambulatory Visit: Payer: Self-pay | Admitting: Neurology

## 2014-06-07 DIAGNOSIS — G4733 Obstructive sleep apnea (adult) (pediatric): Secondary | ICD-10-CM

## 2014-06-16 ENCOUNTER — Ambulatory Visit (INDEPENDENT_AMBULATORY_CARE_PROVIDER_SITE_OTHER): Payer: BC Managed Care – PPO | Admitting: Neurology

## 2014-06-16 DIAGNOSIS — G4733 Obstructive sleep apnea (adult) (pediatric): Secondary | ICD-10-CM

## 2014-06-17 NOTE — Sleep Study (Signed)
Please see the scanned sleep study interpretation located in the Procedure tab within the Chart Review Section 

## 2014-06-28 ENCOUNTER — Telehealth: Payer: Self-pay | Admitting: Neurology

## 2014-06-28 ENCOUNTER — Other Ambulatory Visit: Payer: Self-pay | Admitting: Neurology

## 2014-06-28 DIAGNOSIS — G4733 Obstructive sleep apnea (adult) (pediatric): Secondary | ICD-10-CM

## 2014-06-28 NOTE — Telephone Encounter (Signed)
Patient was contacted to discuss the results from his recent sleep study.  He is aware that he was found to have severe obstructive sleep apnea in which CPAP was initiated during his sleep study. He is aware that an order for him to begin CPAP therapy has been sent to [facility] for processing.  A copy of his sleep study report will be routed to the referring provider and the patient will receive his report via mail as requested.

## 2015-08-05 ENCOUNTER — Encounter: Payer: Self-pay | Admitting: Family Medicine

## 2015-08-10 ENCOUNTER — Encounter: Payer: Self-pay | Admitting: Family Medicine

## 2015-08-13 ENCOUNTER — Other Ambulatory Visit: Payer: Self-pay | Admitting: Family Medicine

## 2015-08-15 ENCOUNTER — Other Ambulatory Visit: Payer: Self-pay | Admitting: Family Medicine

## 2015-08-24 ENCOUNTER — Ambulatory Visit (INDEPENDENT_AMBULATORY_CARE_PROVIDER_SITE_OTHER): Payer: BLUE CROSS/BLUE SHIELD | Admitting: Family Medicine

## 2015-08-24 VITALS — BP 124/78 | HR 94 | Temp 99.3°F | Resp 16 | Ht 67.5 in | Wt 189.0 lb

## 2015-08-24 DIAGNOSIS — Z23 Encounter for immunization: Secondary | ICD-10-CM

## 2015-08-24 DIAGNOSIS — Z119 Encounter for screening for infectious and parasitic diseases, unspecified: Secondary | ICD-10-CM | POA: Diagnosis not present

## 2015-08-24 DIAGNOSIS — J3089 Other allergic rhinitis: Secondary | ICD-10-CM | POA: Diagnosis not present

## 2015-08-24 DIAGNOSIS — Z0000 Encounter for general adult medical examination without abnormal findings: Secondary | ICD-10-CM | POA: Diagnosis not present

## 2015-08-24 DIAGNOSIS — Z125 Encounter for screening for malignant neoplasm of prostate: Secondary | ICD-10-CM

## 2015-08-24 DIAGNOSIS — Z130 Encounter for screening for diseases of the blood and blood-forming organs and certain disorders involving the immune mechanism: Secondary | ICD-10-CM | POA: Diagnosis not present

## 2015-08-24 DIAGNOSIS — Z13220 Encounter for screening for lipoid disorders: Secondary | ICD-10-CM | POA: Diagnosis not present

## 2015-08-24 DIAGNOSIS — Z131 Encounter for screening for diabetes mellitus: Secondary | ICD-10-CM | POA: Diagnosis not present

## 2015-08-24 LAB — CBC
HEMATOCRIT: 42.6 % (ref 39.0–52.0)
Hemoglobin: 14.4 g/dL (ref 13.0–17.0)
MCH: 29.4 pg (ref 26.0–34.0)
MCHC: 33.8 g/dL (ref 30.0–36.0)
MCV: 87.1 fL (ref 78.0–100.0)
MPV: 8.9 fL (ref 8.6–12.4)
Platelets: 407 10*3/uL — ABNORMAL HIGH (ref 150–400)
RBC: 4.89 MIL/uL (ref 4.22–5.81)
RDW: 13.5 % (ref 11.5–15.5)
WBC: 8.7 10*3/uL (ref 4.0–10.5)

## 2015-08-24 LAB — COMPREHENSIVE METABOLIC PANEL
ALK PHOS: 58 U/L (ref 40–115)
ALT: 16 U/L (ref 9–46)
AST: 18 U/L (ref 10–35)
Albumin: 4.5 g/dL (ref 3.6–5.1)
BUN: 14 mg/dL (ref 7–25)
CALCIUM: 9.4 mg/dL (ref 8.6–10.3)
CO2: 23 mmol/L (ref 20–31)
Chloride: 101 mmol/L (ref 98–110)
Creat: 0.87 mg/dL (ref 0.70–1.33)
Glucose, Bld: 93 mg/dL (ref 65–99)
POTASSIUM: 4.1 mmol/L (ref 3.5–5.3)
Sodium: 137 mmol/L (ref 135–146)
TOTAL PROTEIN: 7.7 g/dL (ref 6.1–8.1)
Total Bilirubin: 0.4 mg/dL (ref 0.2–1.2)

## 2015-08-24 LAB — LIPID PANEL
Cholesterol: 229 mg/dL — ABNORMAL HIGH (ref 125–200)
HDL: 53 mg/dL (ref >=40)
LDL Cholesterol: 164 mg/dL — ABNORMAL HIGH (ref <130)
TRIGLYCERIDES: 61 mg/dL (ref <150)
Total CHOL/HDL Ratio: 4.3 Ratio (ref <=5.0)
VLDL: 12 mg/dL (ref <30)

## 2015-08-24 MED ORDER — ALBUTEROL SULFATE HFA 108 (90 BASE) MCG/ACT IN AERS: 2.0000 | INHALATION_SPRAY | Freq: Four times a day (QID) | RESPIRATORY_TRACT | Status: DC | PRN

## 2015-08-24 MED ORDER — AZELASTINE HCL 0.1 % NA SOLN: 1.0000 | Freq: Two times a day (BID) | NASAL | Status: DC

## 2015-08-24 NOTE — Patient Instructions (Signed)
It was great to see you today- I will be in touch with your labs asap Try an OTC hinged knee brace (if this feels like too much you can also try a knee sleeve) while at work for the next couple of months.  If you continue to have problems with your knee please let me know I will make sure that the darkening of the skin on your neck is not due to diabetes.  If your test is negative I agree that this may be due to your bluetooth headset and is nothing to be worried about.    Continue the good work with exercise!    It would be my pleasure to continue to see you as a patient at my new office, starting the last week of February.  If you prefer to remain at [facility] one of my partners will be happy to see you here.   [facility] at [facility] [address] [place], [place] [address] Phone: [phone]

## 2015-08-24 NOTE — Progress Notes (Signed)
[facility] [address], [place] [address] [phone]  Date:  08/24/2015   Name:  Earl Ruiz   DOB:  02-Sep-1960   MRN:  [id number]  PCP:  Lamar Blinks, MD    Chief Complaint: Annual Exam and Medication Refill   History of Present Illness:  Earl Ruiz is a 55 y.o. very pleasant male patient who presents with the following:  Here today for a CPE- history of sleep apnea and allergies.  Last seen by myself in 2015 Colonoscopy: 2014, tetanus and flu shots UTD He will very rarely have wheezing when he wakes up. 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  This has bothered him for 3- 4 months.  No clicking, popping or getting stuck.  No instability.  It is not painful all the time  His wife has noted some darkening of the skin on the back of his neck for the last few months- not sure if this could be due to wearing a blue tooth headset  He is fasting today for labs.   He does not have any drowsiness during the day. His wife has noted decrease of his snoring.    Wt Readings from Last 3 Encounters:  08/24/15 189 lb (85.73 kg)  06/16/14 207 lb (93.895 kg)  05/10/14 207 lb (93.895 kg)    Patient Active Problem List   Diagnosis Date Noted  . Choking episode occurring at night 05/10/2014  . Snoring 05/10/2014  . Sleep apnea 05/10/2014    Past Medical History  Diagnosis Date  . Allergy   . Choking episode occurring at night 05/10/2014  . Snoring 05/10/2014  . Sleep apnea 05/10/2014    Past Surgical History  Procedure Laterality Date  . No prior surgery      Social History  Substance Use Topics  . Smoking status: Never Smoker   . Smokeless tobacco: Never Used  . Alcohol Use: No     Comment: occ    Family History  Problem Relation Age of Onset  . Colon  cancer Neg Hx   . Diabetes Father     No Known Allergies  Medication list has been reviewed and updated.  Current Outpatient Prescriptions on File Prior to Visit  Medication Sig Dispense Refill  . azelastine (ASTELIN) 0.1 % nasal spray Place 1 spray into both nostrils 2 (two) times daily. Use in each nostril as directed 30 mL 12  . fexofenadine (ALLEGRA) 30 MG tablet Take 1 tablet (30 mg total) by mouth 2 (two) times daily. 60 tablet 3  . fluticasone (FLONASE) 50 MCG/ACT nasal spray Place 2 sprays into both nostrils daily. 16 g 2  . ipratropium (ATROVENT) 0.03 % nasal spray Place 2 sprays into the nose 4 (four) times daily. (Patient not taking: Reported on 08/24/2015) 30 mL 6   No current facility-administered medications on file prior to visit.    Review of Systems:  As per HPI- otherwise negative.   Physical Examination: Filed Vitals:   08/24/15 0856  BP: 124/78  Pulse: 94  Temp: 99.3 F (37.4 C)  Resp: 16   Filed Vitals:   08/24/15 0856  Height: 5' 7.5" (1.715 m)  Weight: 189 lb (85.73 kg)   Body mass index is 29.15 kg/(m^2). Ideal Body  Weight: Weight in (lb) to have BMI = 25: 161.7  GEN: WDWN, NAD, Non-toxic, A & O x 3, looks well HEENT: Atraumatic, Normocephalic. Neck supple. No masses, No LAD.  Bilateral TM wnl, oropharynx normal.  PEERL,EOMI.   Hyperpigmentation over the back of his neck but it does not appear thickened like typical acanthosis nigricans  Ears and Nose: No external deformity. CV: RRR, No M/G/R. No JVD. No thrill. No extra heart sounds. PULM: CTA B, no wheezes, crackles, rhonchi. No retractions. No resp. distress. No accessory muscle use. ABD: S, NT, ND, +BS. No rebound. No HSM. EXTR: No c/c/e NEURO Normal gait.  PSYCH: Normally interactive. Conversant. Not depressed or anxious appearing.  Calm demeanor.  GU: normal prostate, testes Right knee:  He has normal ROM, no tenderness at this time, stable joint  Assessment and Plan: Physical  exam  Need for prophylactic vaccination and inoculation against influenza - Plan: Flu Vaccine QUAD 36+ mos IM  Other allergic rhinitis - Plan: azelastine (ASTELIN) 0.1 % nasal spray, albuterol (PROVENTIL HFA;VENTOLIN HFA) 108 (90 Base) MCG/ACT inhaler  Screening for prostate cancer - Plan: PSA  Screening for hyperlipidemia - Plan: Lipid panel  Screening for diabetes mellitus - Plan: Comprehensive metabolic panel, Hemoglobin A1c  Screening for deficiency anemia - Plan: CBC  Screening examination for infectious disease - Plan: Hepatitis C antibody  Flu shot, CPE, refills today Follow-up pending his lab results  He will try a knee support as needed and let me know if this does not improve   Signed Lamar Blinks, MD

## 2015-08-25 ENCOUNTER — Encounter: Payer: Self-pay | Admitting: Family Medicine

## 2015-08-25 DIAGNOSIS — R7303 Prediabetes: Secondary | ICD-10-CM | POA: Insufficient documentation

## 2015-08-25 LAB — HEMOGLOBIN A1C
Hgb A1c MFr Bld: 5.9 % — ABNORMAL HIGH (ref <5.7)
Mean Plasma Glucose: 123 mg/dL — ABNORMAL HIGH (ref <117)

## 2015-08-25 LAB — HEPATITIS C ANTIBODY: HCV Ab: NEGATIVE

## 2015-08-25 LAB — PSA: PSA: 0.32 ng/mL (ref <=4.00)

## 2015-10-31 ENCOUNTER — Telehealth: Payer: Self-pay | Admitting: Family Medicine

## 2015-10-31 MED ORDER — MELOXICAM 15 MG PO TABS
15.0000 mg | ORAL_TABLET | Freq: Every day | ORAL | Status: DC
Start: 2015-10-31 — End: 2016-07-31

## 2015-10-31 NOTE — Telephone Encounter (Signed)
Patient is in [place] and having knee pain, taken Meloxicam 15 mg with good results; would like Rx forwarded to [facility] in [place] 04/17 Please Advise.

## 2015-10-31 NOTE — Telephone Encounter (Signed)
Pt says that he was seen by provider for knee pain. He says that they discussed a medication that would help on his last visit with her. Pt says that he is out of town and would like to know if provider is able to prescribe and sent that medication to a pharmacy near by him?   Please advise pt further.   CB: [phone]     Thanks.

## 2015-11-24 ENCOUNTER — Encounter: Payer: Self-pay | Admitting: Family Medicine

## 2015-11-24 ENCOUNTER — Ambulatory Visit (INDEPENDENT_AMBULATORY_CARE_PROVIDER_SITE_OTHER): Payer: BLUE CROSS/BLUE SHIELD | Admitting: Family Medicine

## 2015-11-24 VITALS — BP 126/81 | HR 79 | Temp 97.4°F | Ht 68.0 in | Wt 195.0 lb

## 2015-11-24 DIAGNOSIS — H6122 Impacted cerumen, left ear: Secondary | ICD-10-CM

## 2015-11-24 NOTE — Patient Instructions (Signed)
Let me know if you have any further problems with your ear.

## 2015-11-24 NOTE — Progress Notes (Signed)
[facility] [address] [place], [place] [address] [phone]  Date:  11/24/2015   Name:  Earl Ruiz   DOB:  1961-07-15   MRN:  [id number]  PCP:  Lamar Blinks, MD    Chief Complaint: Ear Pain   History of Present Illness:  Earl Ruiz is a 55 y.o. very pleasant male patient who presents with the following:  Here today to discuss a concern with his left ear.   History of pre-diabetes with A1c if 5.9 He notes some congestion and popping of the left ear that started about 2 weeks ago. She used some ear drops that his wife had at home.  He cleaned his ears with a qtip and got some dark Herms stuff from inside.  He does use protective ear plugs at work He hears a ringing sound in the right ear.  It is not hurting No fever or ST, the right ear is ok No cough, no vomiting, etc  Patient Active Problem List   Diagnosis Date Noted  . Pre-diabetes 08/25/2015  . Choking episode occurring at night 05/10/2014  . Snoring 05/10/2014  . Sleep apnea 05/10/2014    Past Medical History  Diagnosis Date  . Allergy   . Choking episode occurring at night 05/10/2014  . Snoring 05/10/2014  . Sleep apnea 05/10/2014    Past Surgical History  Procedure Laterality Date  . No prior surgery      Social History  Substance Use Topics  . Smoking status: Never Smoker   . Smokeless tobacco: Never Used  . Alcohol Use: No     Comment: occ    Family History  Problem Relation Age of Onset  . Colon cancer Neg Hx   . Diabetes Father     No Known Allergies  Medication list has been reviewed and updated.  Current Outpatient Prescriptions on File Prior to Visit  Medication Sig Dispense Refill  . albuterol (PROVENTIL HFA;VENTOLIN HFA) 108 (90 Base) MCG/ACT inhaler Inhale 2 puffs into the lungs every 6 (six) hours as needed for wheezing or shortness of breath. 1 Inhaler 0  . azelastine (ASTELIN) 0.1 % nasal spray Place 1 spray  into both nostrils 2 (two) times daily. Use in each nostril as directed 30 mL 12  . fexofenadine (ALLEGRA) 30 MG tablet Take 1 tablet (30 mg total) by mouth 2 (two) times daily. 60 tablet 3  . fluticasone (FLONASE) 50 MCG/ACT nasal spray Place 2 sprays into both nostrils daily. 16 g 2  . meloxicam (MOBIC) 15 MG tablet Take 1 tablet (15 mg total) by mouth daily. Use as needed for knee pain 30 tablet 1   No current facility-administered medications on file prior to visit.    Review of Systems:  As per HPI- otherwise negative.   Physical Examination: Filed Vitals:   11/24/15 0936  BP: 126/81  Pulse: 79  Temp: 97.4 F (36.3 C)   Filed Vitals:   11/24/15 0936  Height: 5\' 8"  (1.727 m)  Weight: 195 lb (88.451 kg)   Body mass index is 29.66 kg/(m^2). Ideal Body Weight: Weight in (lb) to have BMI = 25: 164.1  GEN: WDWN, NAD, Non-toxic, A & O x 3 HEENT: Atraumatic, Normocephalic. Neck supple. No masses, No LAD.   Left TM obscured with wax, right TM wnl, oropharynx normal.  PEERL,EOMI.   Ears and Nose: No external deformity. CV: RRR, No M/G/R. No JVD. No thrill. No extra heart  sounds. PULM: CTA B, no wheezes, crackles, rhonchi. No retractions. No resp. distress. No accessory muscle use. EXTR: No c/c/e NEURO Normal gait.  PSYCH: Normally interactive. Conversant. Not depressed or anxious appearing.  Calm demeanor.   Left ear with cerumen impaction. This was irrigatted and removed  Assessment and Plan: Cerumen impaction, left  Irrigated and resolved.  No other concerns today  Signed Lamar Blinks, MD

## 2016-07-31 ENCOUNTER — Other Ambulatory Visit: Payer: Self-pay | Admitting: Family Medicine

## 2016-10-24 ENCOUNTER — Other Ambulatory Visit: Payer: Self-pay | Admitting: Family Medicine

## 2016-10-24 ENCOUNTER — Other Ambulatory Visit: Payer: Self-pay | Admitting: Emergency Medicine

## 2016-10-24 ENCOUNTER — Telehealth: Payer: Self-pay | Admitting: Family Medicine

## 2016-10-24 DIAGNOSIS — J3089 Other allergic rhinitis: Secondary | ICD-10-CM

## 2016-10-24 MED ORDER — ALBUTEROL SULFATE HFA 108 (90 BASE) MCG/ACT IN AERS: 2.0000 | INHALATION_SPRAY | Freq: Four times a day (QID) | RESPIRATORY_TRACT | 0 refills | Status: DC | PRN

## 2016-10-24 MED ORDER — AZELASTINE HCL 0.1 % NA SOLN: 1.0000 | Freq: Two times a day (BID) | NASAL | 12 refills | Status: DC

## 2016-10-24 NOTE — Telephone Encounter (Signed)
°  Relation to HY:HOOI Call back number:[phone] Pharmacy: [facility], [place] [phone] (Phone) [phone] (Fax)     Reason for call:  Patient requesting a refill azelastine (ASTELIN) 0.1 % nasal spray and albuterol (PROVENTIL HFA;VENTOLIN HFA) 108 (90 Base) MCG/ACT inhaler

## 2016-10-24 NOTE — Telephone Encounter (Signed)
Refills sent to [facility] per pt request.

## 2017-05-05 NOTE — Progress Notes (Addendum)
[facility] [place], [place],  [address] [phone]  Date:  05/08/2017   Name:  Earl Ruiz   DOB:  [birth year]   MRN:  [id number]  PCP:  Darreld Mclean, MD    Chief Complaint: Annual Exam   History of Present Illness:  Earl Ruiz is a 56 y.o. very pleasant male patient who presents with the following:  History of pre-diabetes and OSA- here today for a CPE Physical in 2/17 Flu:due, will do today Tetanus:2015 Labs:due, he is fasting since 0430 am Colon: 2014  His snoring is better with his inhaler- he uses this before bed and feels that he slepes better with this   He went to the [employer] game in [place] not long ago and had a very good time- his wife took him on this trip  He is exercising on treadmill, eliptical machine, bike No CP or SOB Bowels are working well Urination and erections are fine  Patient Active Problem List   Diagnosis Date Noted  . Pre-diabetes 08/25/2015  . Choking episode occurring at night 05/10/2014  . Snoring 05/10/2014  . Sleep apnea 05/10/2014    Past Medical History:  Diagnosis Date  . Allergy   . Choking episode occurring at night 05/10/2014  . Sleep apnea 05/10/2014  . Snoring 05/10/2014    Past Surgical History:  Procedure Laterality Date  . no prior surgery      Social History  Substance Use Topics  . Smoking status: Never Smoker  . Smokeless tobacco: Never Used  . Alcohol use No     Comment: occ    Family History  Problem Relation Age of Onset  . Diabetes Father   . Colon cancer Neg Hx     No Known Allergies  Medication list has been reviewed and updated.  Current Outpatient Prescriptions on File Prior to Visit  Medication Sig Dispense Refill  . albuterol (PROVENTIL HFA;VENTOLIN HFA) 108 (90 Base) MCG/ACT inhaler Inhale 2 puffs into the lungs every 6 (six) hours as needed for wheezing or shortness of breath. 1 Inhaler 0  .  azelastine (ASTELIN) 0.1 % nasal spray Place 1 spray into both nostrils 2 (two) times daily. Use in each nostril as directed 30 mL 12  . fexofenadine (ALLEGRA) 30 MG tablet Take 1 tablet (30 mg total) by mouth 2 (two) times daily. 60 tablet 3  . meloxicam (MOBIC) 15 MG tablet TAKE 1 TAB DAILY AS NEEDED FOR KNEE PAIN 90 tablet 1   No current facility-administered medications on file prior to visit.     Review of Systems:  As per HPI- otherwise negative. No fever or chills No CP or SOB   Physical Examination: Vitals:   05/08/17 1452  BP: 130/90  Pulse: 73  Temp: 98.1 F (36.7 C)  SpO2: 98%   Vitals:   05/08/17 1452  Weight: 202 lb (91.6 kg)  Height: 5\' 8"  (1.727 m)   Body mass index is 30.71 kg/m. Ideal Body Weight: Weight in (lb) to have BMI = 25: 164.1  GEN: WDWN, NAD, Non-toxic, A & O x 3, obese, looks well HEENT: Atraumatic, Normocephalic. Neck supple. No masses, No LAD.  Bilateral TM wnl, oropharynx normal.  PEERL,EOMI.   Ears and Nose: No external deformity. CV: RRR, No M/G/R. No JVD. No thrill. No extra heart sounds. PULM: CTA B, no wheezes, crackles, rhonchi. No retractions. No resp. distress. No accessory muscle use. ABD: S,  NT, ND, +BS. No rebound. No HSM. EXTR: No c/c/e NEURO Normal gait.  PSYCH: Normally interactive. Conversant. Not depressed or anxious appearing.  Calm demeanor.   Lab Results  Component Value Date   PSA 0.32 08/24/2015   PSA 0.30 04/12/2014   PSA 0.27 03/11/2013    Assessment and Plan: Physical exam  Screening for prostate cancer - Plan: PSA  Screening for hyperlipidemia - Plan: Lipid panel  Screening for diabetes mellitus - Plan: Comprehensive metabolic panel, Hemoglobin A1c  Screening examination for infectious disease - Plan: CBC  Chronic pain of right knee - Plan: meloxicam (MOBIC) 15 MG tablet  CPE today Flu shot given Refilled mobic for his knee pain- this helps, he is not at the point of considering surgery yet He  does have to get up an urinate about twice a night- not enough to be too much of a bother yet .Will plan further follow- up pending labs.   Signed Lamar Blinks, MD Received his labs 10/28- message to pt Blood count is normal Metabolic profile is normal A1c is still in the PRE-diabetes range  I calculated your estimated 10- year risk of cardiovascular disease (based on age, BP, cholesterol) and you are at 13%.  This is a bit high for me- I would recommend that we start a cholesterol medication to try and bring your risk down.  Would this be ok with you?  Please let me know, and I can call something in if you agree.   Otherwise let's plan to visit in about 6 months, take care!   Results for orders placed or performed in visit on 05/08/17  CBC  Result Value Ref Range   WBC 7.5 4.0 - 10.5 K/uL   RBC 4.62 4.22 - 5.81 Mil/uL   Platelets 384.0 150.0 - 400.0 K/uL   Hemoglobin 13.7 13.0 - 17.0 g/dL   HCT 42.2 39.0 - 52.0 %   MCV 91.2 78.0 - 100.0 fl   MCHC 32.6 30.0 - 36.0 g/dL   RDW 13.4 11.5 - 15.5 %  Comprehensive metabolic panel  Result Value Ref Range   Sodium 139 135 - 145 mEq/L   Potassium 4.4 3.5 - 5.1 mEq/L   Chloride 101 96 - 112 mEq/L   CO2 28 19 - 32 mEq/L   Glucose, Bld 83 70 - 99 mg/dL   BUN 12 6 - 23 mg/dL   Creatinine, Ser 0.78 0.40 - 1.50 mg/dL   Total Bilirubin 0.5 0.2 - 1.2 mg/dL   Alkaline Phosphatase 65 39 - 117 U/L   AST 17 0 - 37 U/L   ALT 21 0 - 53 U/L   Total Protein 7.5 6.0 - 8.3 g/dL   Albumin 4.7 3.5 - 5.2 g/dL   Calcium 9.9 8.4 - 10.5 mg/dL   GFR 132.55 >60.00 mL/min  Hemoglobin A1c  Result Value Ref Range   Hgb A1c MFr Bld 6.1 4.6 - 6.5 %  Lipid panel  Result Value Ref Range   Cholesterol 237 (H) 0 - 200 mg/dL   Triglycerides 86.0 0.0 - 149.0 mg/dL   HDL 51.80 >39.00 mg/dL   VLDL 17.2 0.0 - 40.0 mg/dL   LDL Cholesterol 168 (H) 0 - 99 mg/dL   Total CHOL/HDL Ratio 5    NonHDL 185.43   PSA  Result Value Ref Range   PSA 0.28 0.10 - 4.00  ng/mL   CV risk estimated at

## 2017-05-08 ENCOUNTER — Encounter: Payer: Self-pay | Admitting: Family Medicine

## 2017-05-08 ENCOUNTER — Ambulatory Visit (INDEPENDENT_AMBULATORY_CARE_PROVIDER_SITE_OTHER): Payer: BLUE CROSS/BLUE SHIELD | Admitting: Family Medicine

## 2017-05-08 VITALS — BP 130/90 | HR 73 | Temp 98.1°F | Ht 68.0 in | Wt 202.0 lb

## 2017-05-08 DIAGNOSIS — M25561 Pain in right knee: Secondary | ICD-10-CM | POA: Diagnosis not present

## 2017-05-08 DIAGNOSIS — Z131 Encounter for screening for diabetes mellitus: Secondary | ICD-10-CM | POA: Diagnosis not present

## 2017-05-08 DIAGNOSIS — Z125 Encounter for screening for malignant neoplasm of prostate: Secondary | ICD-10-CM | POA: Diagnosis not present

## 2017-05-08 DIAGNOSIS — G8929 Other chronic pain: Secondary | ICD-10-CM

## 2017-05-08 DIAGNOSIS — Z13220 Encounter for screening for lipoid disorders: Secondary | ICD-10-CM | POA: Diagnosis not present

## 2017-05-08 DIAGNOSIS — Z0000 Encounter for general adult medical examination without abnormal findings: Secondary | ICD-10-CM | POA: Diagnosis not present

## 2017-05-08 DIAGNOSIS — Z119 Encounter for screening for infectious and parasitic diseases, unspecified: Secondary | ICD-10-CM

## 2017-05-08 MED ORDER — MELOXICAM 15 MG PO TABS: ORAL_TABLET | ORAL | 2 refills | Status: DC

## 2017-05-08 NOTE — Patient Instructions (Signed)
It was nice to see you again today!  Take care and I will be in touch with your labs asap Continue to exercise and eat well I will check your PSA to screen for prostate cancer today as well   Health Maintenance, Male A healthy lifestyle and preventive care is important for your health and wellness. Ask your health care provider about what schedule of regular examinations is right for you. What should I know about weight and diet? Eat a Healthy Diet  Eat plenty of vegetables, fruits, whole grains, low-fat dairy products, and lean protein.  Do not eat a lot of foods high in solid fats, added sugars, or salt.  Maintain a Healthy Weight Regular exercise can help you achieve or maintain a healthy weight. You should:  Do at least 150 minutes of exercise each week. The exercise should increase your heart rate and make you sweat (moderate-intensity exercise).  Do strength-training exercises at least twice a week.  Watch Your Levels of Cholesterol and Blood Lipids  Have your blood tested for lipids and cholesterol every 5 years starting at 55 years of age. If you are at high risk for heart disease, you should start having your blood tested when you are 55 years old. You may need to have your cholesterol levels checked more often if: ? Your lipid or cholesterol levels are high. ? You are older than 55 years of age. ? You are at high risk for heart disease.  What should I know about cancer screening? Many types of cancers can be detected early and may often be prevented. Lung Cancer  You should be screened every year for lung cancer if: ? You are a current smoker who has smoked for at least 30 years. ? You are a former smoker who has quit within the past 15 years.  Talk to your health care provider about your screening options, when you should start screening, and how often you should be screened.  Colorectal Cancer  Routine colorectal cancer screening usually begins at 55 years of age  and should be repeated every 5-10 years until you are 55 years old. You may need to be screened more often if early forms of precancerous polyps or small growths are found. Your health care provider may recommend screening at an earlier age if you have risk factors for colon cancer.  Your health care provider may recommend using home test kits to check for hidden blood in the stool.  A small camera at the end of a tube can be used to examine your colon (sigmoidoscopy or colonoscopy). This checks for the earliest forms of colorectal cancer.  Prostate and Testicular Cancer  Depending on your age and overall health, your health care provider may do certain tests to screen for prostate and testicular cancer.  Talk to your health care provider about any symptoms or concerns you have about testicular or prostate cancer.  Skin Cancer  Check your skin from head to toe regularly.  Tell your health care provider about any new moles or changes in moles, especially if: ? There is a change in a mole's size, shape, or color. ? You have a mole that is larger than a pencil eraser.  Always use sunscreen. Apply sunscreen liberally and repeat throughout the day.  Protect yourself by wearing long sleeves, pants, a wide-brimmed hat, and sunglasses when outside.  What should I know about heart disease, diabetes, and high blood pressure?  If you are 35-36 years of age, have  your blood pressure checked every 3-5 years. If you are 30 years of age or older, have your blood pressure checked every year. You should have your blood pressure measured twice-once when you are at a hospital or clinic, and once when you are not at a hospital or clinic. Record the average of the two measurements. To check your blood pressure when you are not at a hospital or clinic, you can use: ? An automated blood pressure machine at a pharmacy. ? A home blood pressure monitor.  Talk to your health care provider about your target blood  pressure.  If you are between 84-30 years old, ask your health care provider if you should take aspirin to prevent heart disease.  Have regular diabetes screenings by checking your fasting blood sugar level. ? If you are at a normal weight and have a low risk for diabetes, have this test once every three years after the age of 40. ? If you are overweight and have a high risk for diabetes, consider being tested at a younger age or more often.  A one-time screening for abdominal aortic aneurysm (AAA) by ultrasound is recommended for men aged 67-75 years who are current or former smokers. What should I know about preventing infection? Hepatitis B If you have a higher risk for hepatitis B, you should be screened for this virus. Talk with your health care provider to find out if you are at risk for hepatitis B infection. Hepatitis C Blood testing is recommended for:  Everyone born from [birth year] through 1964.  Anyone with known risk factors for hepatitis C.  Sexually Transmitted Diseases (STDs)  You should be screened each year for STDs including gonorrhea and chlamydia if: ? You are sexually active and are younger than 55 years of age. ? You are older than 55 years of age and your health care provider tells you that you are at risk for this type of infection. ? Your sexual activity has changed since you were last screened and you are at an increased risk for chlamydia or gonorrhea. Ask your health care provider if you are at risk.  Talk with your health care provider about whether you are at high risk of being infected with HIV. Your health care provider may recommend a prescription medicine to help prevent HIV infection.  What else can I do?  Schedule regular health, dental, and eye exams.  Stay current with your vaccines (immunizations).  Do not use any tobacco products, such as cigarettes, chewing tobacco, and e-cigarettes. If you need help quitting, ask your health care  provider.  Limit alcohol intake to no more than 2 drinks per day. One drink equals 12 ounces of beer, 5 ounces of wine, or 1 ounces of hard liquor.  Do not use street drugs.  Do not share needles.  Ask your health care provider for help if you need support or information about quitting drugs.  Tell your health care provider if you often feel depressed.  Tell your health care provider if you have ever been abused or do not feel safe at home. This information is not intended to replace advice given to you by your health care provider. Make sure you discuss any questions you have with your health care provider. Document Released: 12/29/2007 Document Revised: 02/29/2016 Document Reviewed: 04/05/2015 Elsevier Interactive Patient Education  [employer].

## 2017-05-09 LAB — COMPREHENSIVE METABOLIC PANEL
ALBUMIN: 4.7 g/dL (ref 3.5–5.2)
ALK PHOS: 65 U/L (ref 39–117)
ALT: 21 U/L (ref 0–53)
AST: 17 U/L (ref 0–37)
BUN: 12 mg/dL (ref 6–23)
CHLORIDE: 101 meq/L (ref 96–112)
CO2: 28 mEq/L (ref 19–32)
CREATININE: 0.78 mg/dL (ref 0.40–1.50)
Calcium: 9.9 mg/dL (ref 8.4–10.5)
GFR: 132.55 mL/min (ref >60.00)
Glucose, Bld: 83 mg/dL (ref 70–99)
Potassium: 4.4 mEq/L (ref 3.5–5.1)
Sodium: 139 mEq/L (ref 135–145)
TOTAL PROTEIN: 7.5 g/dL (ref 6.0–8.3)
Total Bilirubin: 0.5 mg/dL (ref 0.2–1.2)

## 2017-05-09 LAB — LIPID PANEL
Cholesterol: 237 mg/dL — ABNORMAL HIGH (ref 0–200)
HDL: 51.8 mg/dL (ref >39.00)
LDL Cholesterol: 168 mg/dL — ABNORMAL HIGH (ref 0–99)
NONHDL: 185.43
Total CHOL/HDL Ratio: 5
Triglycerides: 86 mg/dL (ref 0.0–149.0)
VLDL: 17.2 mg/dL (ref 0.0–40.0)

## 2017-05-09 LAB — CBC
HCT: 42.2 % (ref 39.0–52.0)
Hemoglobin: 13.7 g/dL (ref 13.0–17.0)
MCHC: 32.6 g/dL (ref 30.0–36.0)
MCV: 91.2 fl (ref 78.0–100.0)
PLATELETS: 384 10*3/uL (ref 150.0–400.0)
RBC: 4.62 Mil/uL (ref 4.22–5.81)
RDW: 13.4 % (ref 11.5–15.5)
WBC: 7.5 10*3/uL (ref 4.0–10.5)

## 2017-05-09 LAB — PSA: PSA: 0.28 ng/mL (ref 0.10–4.00)

## 2017-05-09 LAB — HEMOGLOBIN A1C: HEMOGLOBIN A1C: 6.1 % (ref 4.6–6.5)

## 2017-05-12 ENCOUNTER — Encounter: Payer: Self-pay | Admitting: Family Medicine

## 2017-05-12 DIAGNOSIS — E782 Mixed hyperlipidemia: Secondary | ICD-10-CM

## 2017-05-12 DIAGNOSIS — E1169 Type 2 diabetes mellitus with other specified complication: Secondary | ICD-10-CM

## 2017-05-12 DIAGNOSIS — E785 Hyperlipidemia, unspecified: Principal | ICD-10-CM

## 2017-05-14 ENCOUNTER — Telehealth: Payer: Self-pay | Admitting: Family Medicine

## 2017-05-14 ENCOUNTER — Encounter: Payer: Self-pay | Admitting: Family Medicine

## 2017-05-14 DIAGNOSIS — N401 Enlarged prostate with lower urinary tract symptoms: Secondary | ICD-10-CM

## 2017-05-14 DIAGNOSIS — R350 Frequency of micturition: Principal | ICD-10-CM

## 2017-05-14 MED ORDER — LOVASTATIN 40 MG PO TABS: 40.0000 mg | ORAL_TABLET | Freq: Every day | ORAL | 3 refills | Status: DC

## 2017-05-14 NOTE — Telephone Encounter (Signed)
Called and LMOM-  I replied on mychart- let me know if any other questions

## 2017-05-14 NOTE — Telephone Encounter (Signed)
Patient is wanting to OK the conversation from my chart. And wanted to confirmed the order of medications   Patient is requesting a call.   CB:[phone]

## 2017-05-15 MED ORDER — TAMSULOSIN HCL 0.4 MG PO CAPS: 0.4000 mg | ORAL_CAPSULE | Freq: Every day | ORAL | 6 refills | Status: DC

## 2017-05-15 NOTE — Telephone Encounter (Signed)
Called pt back- he actually meant medication for BPH He is having to urinate frequently and it can disturb his sleep He would like to try flomax- will rx for him. He will let me know how it works in 2-3 weeks

## 2018-01-02 ENCOUNTER — Other Ambulatory Visit: Payer: Self-pay | Admitting: Family Medicine

## 2018-01-02 DIAGNOSIS — J3089 Other allergic rhinitis: Secondary | ICD-10-CM

## 2018-03-11 ENCOUNTER — Other Ambulatory Visit: Payer: Self-pay | Admitting: Family Medicine

## 2018-03-11 DIAGNOSIS — R350 Frequency of micturition: Principal | ICD-10-CM

## 2018-03-11 DIAGNOSIS — N401 Enlarged prostate with lower urinary tract symptoms: Secondary | ICD-10-CM

## 2018-03-18 NOTE — Progress Notes (Deleted)
[place] [facility] [address], [place], [place] [address] [phone]  Date:  03/19/2018   Name:  Earl Ruiz   DOB:  1960/10/07   MRN:  [id number]  PCP:  Darreld Mclean, MD    Chief Complaint: No chief complaint on file.   History of Present Illness:  Earl Ruiz is a 57 y.o. very pleasant male patient who presents with the following:  Following up on medications today History of pre-diabetes, OSA, dyslipidemia Last seen here in October, following lab notes at that time:  Blood count is normal Metabolic profile is normal A1c is still in the PRE-diabetes range I calculated your estimated 10- year risk of cardiovascular disease (based on age, BP, cholesterol) and you are at 13%.  This is a bit high for me- I would recommend that we start a cholesterol medication to try and bring your risk down.  Would this be ok with you?  Please let me know, and I can call something in if you agree.   Otherwise let's plan to visit in about 6 months, take care!   We did start him on lovastatin  Flu  Lab Results  Component Value Date   HGBA1C 6.1 05/08/2017    Patient Active Problem List   Diagnosis Date Noted  . Pre-diabetes 08/25/2015  . Choking episode occurring at night 05/10/2014  . Snoring 05/10/2014  . Sleep apnea 05/10/2014    Past Medical History:  Diagnosis Date  . Allergy   . Choking episode occurring at night 05/10/2014  . Sleep apnea 05/10/2014  . Snoring 05/10/2014    Past Surgical History:  Procedure Laterality Date  . no prior surgery      Social History   Tobacco Use  . Smoking status: Never Smoker  . Smokeless tobacco: Never Used  Substance Use Topics  . Alcohol use: No    Comment: occ  . Drug use: No    Family History  Problem Relation Age of Onset  . Diabetes Father   . Colon cancer Neg Hx     No Known Allergies  Medication list has been reviewed and updated.  Current  Outpatient Medications on File Prior to Visit  Medication Sig Dispense Refill  . albuterol (PROVENTIL HFA;VENTOLIN HFA) 108 (90 Base) MCG/ACT inhaler Inhale 2 puffs into the lungs every 6 (six) hours as needed for wheezing or shortness of breath. 1 Inhaler 0  . azelastine (ASTELIN) 0.1 % nasal spray PLACE 1 SPRAY INTO BOTH NOSTRILS TWICE A DAY , USE IN EACH NOSTRIL AS DIRECTED 30 mL 12  . fexofenadine (ALLEGRA) 30 MG tablet Take 1 tablet (30 mg total) by mouth 2 (two) times daily. 60 tablet 3  . lovastatin (MEVACOR) 40 MG tablet Take 1 tablet (40 mg total) by mouth at bedtime. 90 tablet 3  . meloxicam (MOBIC) 15 MG tablet TAKE 1 TAB DAILY AS NEEDED FOR KNEE PAIN 90 tablet 2  . tamsulosin (FLOMAX) 0.4 MG CAPS capsule TAKE ONE CAPSULE BY MOUTH EVERY DAY 30 capsule 0   No current facility-administered medications on file prior to visit.     Review of Systems:  As per HPI- otherwise negative.    Physical Examination: There were no vitals filed for this visit. There were no vitals filed for this visit. There is no height or weight on file to calculate BMI. Ideal Body Weight:    GEN: WDWN, NAD, Non-toxic, A & O x 3  HEENT: Atraumatic, Normocephalic. Neck supple. No masses, No LAD. Ears and Nose: No external deformity. CV: RRR, No M/G/R. No JVD. No thrill. No extra heart sounds. PULM: CTA B, no wheezes, crackles, rhonchi. No retractions. No resp. distress. No accessory muscle use. ABD: S, NT, ND, +BS. No rebound. No HSM. EXTR: No c/c/e NEURO Normal gait.  PSYCH: Normally interactive. Conversant. Not depressed or anxious appearing.  Calm demeanor.    Assessment and Plan: ***  Signed Lamar Blinks, MD

## 2018-03-19 ENCOUNTER — Ambulatory Visit: Payer: BLUE CROSS/BLUE SHIELD | Admitting: Family Medicine

## 2018-03-20 ENCOUNTER — Other Ambulatory Visit: Payer: Self-pay | Admitting: Family Medicine

## 2018-03-20 DIAGNOSIS — R350 Frequency of micturition: Principal | ICD-10-CM

## 2018-03-20 DIAGNOSIS — N401 Enlarged prostate with lower urinary tract symptoms: Secondary | ICD-10-CM

## 2018-03-20 MED ORDER — TAMSULOSIN HCL 0.4 MG PO CAPS: 0.4000 mg | ORAL_CAPSULE | Freq: Every day | ORAL | 1 refills | Status: DC

## 2018-03-24 ENCOUNTER — Other Ambulatory Visit: Payer: Self-pay | Admitting: Family Medicine

## 2018-03-24 DIAGNOSIS — N401 Enlarged prostate with lower urinary tract symptoms: Secondary | ICD-10-CM

## 2018-03-24 DIAGNOSIS — R350 Frequency of micturition: Principal | ICD-10-CM

## 2018-05-12 ENCOUNTER — Encounter: Payer: BLUE CROSS/BLUE SHIELD | Admitting: Family Medicine

## 2018-05-16 NOTE — Progress Notes (Addendum)
[place] [facility] [place], [place], [place] [address] [phone]  Date:  05/19/2018   Name:  Earl Ruiz   DOB:  04-Jun-1961   MRN:  [id number]  PCP:  Darreld Mclean, MD    Chief Complaint: Annual Exam (flu shot, taking saxenda, lower abdominal pain, flomax side effect? )   History of Present Illness:  Earl Ruiz is a 57 y.o. very pleasant male patient who presents with the following:  Here today for a CPE History of pre-diabetes, OSA, dyslipidemia, overweight He has seen sleep med in the past but not in a while  Last seen by myself about a year ago at which time we added lovastatin due to dyslipidemia- however he is no longer taking this  He is overall doing well He notes that he started saxenda though a weight and wellness center in [place].  He has lost about 15 lbs from his highest weight.  While he was on saxenda he would sometimes have a discomfort in his left lower abd which would be relieved by having a BM. He is now off saxenda and this sx stopped   He is using flomax once a day, and his urinary sx are much better.  He only has to get up to urinate maybe once a night   Lab Results  Component Value Date   HGBA1C 6.1 05/08/2017   Labs: a year ago.  Immun:  Flu- do today for him  ?shingrix- he does not recall having had chicken pox, will test him for same  Colon:  2014  Albuterol prn astelin saxenda flomax Lovastatin- however he is not taking   He and his wife have joined [employer] and plan to work on exercise more  When he is physically active he does not have any CP or SOB Wt Readings from Last 3 Encounters:  05/19/18 193 lb (87.5 kg)  05/08/17 202 lb (91.6 kg)  11/24/15 195 lb (88.5 kg)     Patient Active Problem List   Diagnosis Date Noted  . Pre-diabetes 08/25/2015  . Choking episode occurring at night 05/10/2014  . Snoring 05/10/2014  . Sleep apnea 05/10/2014    Past Medical  History:  Diagnosis Date  . Allergy   . Choking episode occurring at night 05/10/2014  . Sleep apnea 05/10/2014  . Snoring 05/10/2014    Past Surgical History:  Procedure Laterality Date  . no prior surgery      Social History   Tobacco Use  . Smoking status: Never Smoker  . Smokeless tobacco: Never Used  Substance Use Topics  . Alcohol use: No    Comment: occ  . Drug use: No    Family History  Problem Relation Age of Onset  . Diabetes Father   . Colon cancer Neg Hx     No Known Allergies  Medication list has been reviewed and updated.  Current Outpatient Medications on File Prior to Visit  Medication Sig Dispense Refill  . albuterol (PROVENTIL HFA;VENTOLIN HFA) 108 (90 Base) MCG/ACT inhaler Inhale 2 puffs into the lungs every 6 (six) hours as needed for wheezing or shortness of breath. 1 Inhaler 0  . azelastine (ASTELIN) 0.1 % nasal spray PLACE 1 SPRAY INTO BOTH NOSTRILS TWICE A DAY , USE IN EACH NOSTRIL AS DIRECTED 30 mL 12  . fexofenadine (ALLEGRA) 30 MG tablet Take 1 tablet (30 mg total) by mouth 2 (two) times daily. 60 tablet  3  . meloxicam (MOBIC) 15 MG tablet TAKE 1 TAB DAILY AS NEEDED FOR KNEE PAIN 90 tablet 2  . SAXENDA 18 MG/3ML SOPN INJECT 3 MG BY SUBCUTANEOUS ROUTE ONCE DAILY IN THE ABDOMEN THIGH OR UPPER ARM  0  . tamsulosin (FLOMAX) 0.4 MG CAPS capsule TAKE ONE CAPSULE BY MOUTH EVERY DAY 90 capsule 0  . lovastatin (MEVACOR) 40 MG tablet Take 1 tablet (40 mg total) by mouth at bedtime. (Patient not taking: Reported on 05/19/2018) 90 tablet 3   No current facility-administered medications on file prior to visit.     Review of Systems:  As per HPI- otherwise negative.  BP Readings from Last 3 Encounters:  05/19/18 136/88  05/08/17 130/90  11/24/15 126/81    Physical Examination: Vitals:   05/19/18 0944  BP: 136/88  Pulse: 70  Resp: 16  Temp: 98.1 F (36.7 C)  SpO2: 99%   Vitals:   05/19/18 0944  Weight: 193 lb (87.5 kg)  Height: 5\' 8"   (1.727 m)   Body mass index is 29.35 kg/m. Ideal Body Weight: Weight in (lb) to have BMI = 25: 164.1  GEN: WDWN, NAD, Non-toxic, A & O x 3, lools well, minimal overweight  HEENT: Atraumatic, Normocephalic. Neck supple. No masses, No LAD.  Bilateral TM wnl, oropharynx normal.  PEERL,EOMI.   Ears and Nose: No external deformity. CV: RRR, No M/G/R. No JVD. No thrill. No extra heart sounds. PULM: CTA B, no wheezes, crackles, rhonchi. No retractions. No resp. distress. No accessory muscle use. ABD: S, NT, ND, +BS. No rebound. No HSM. EXTR: No c/c/e NEURO Normal gait.  PSYCH: Normally interactive. Conversant. Not depressed or anxious appearing.  Calm demeanor.    Assessment and Plan: Physical exam  Benign prostatic hyperplasia with urinary frequency - Plan: PSA, tamsulosin (FLOMAX) 0.4 MG CAPS capsule  Mixed hyperlipidemia - Plan: Lipid panel  Screening for diabetes mellitus - Plan: Comprehensive metabolic panel, Hemoglobin A1c  Screening for deficiency anemia - Plan: CBC  Other allergic rhinitis - Plan: albuterol (PROVENTIL HFA;VENTOLIN HFA) 108 (90 Base) MCG/ACT inhaler, azelastine (ASTELIN) 0.1 % nasal spray  Exposure to intestinal infectious disease - Plan: Varicella zoster antibody, IgG  Need for influenza vaccination - Plan: Flu Vaccine QUAD 6+ mos PF IM (Fluarix Quad PF)  CPE - he is doing well, hast lost weight BP looks fine Now off saxenda, he plans to try and lose 10 lbs more  Flu shot tiven Refilled albuterol and astelin, flomax Will plan further follow- up pending labs.   Signed Lamar Blinks, MD  Received his labs, message to pt  Results for orders placed or performed in visit on 05/19/18  CBC  Result Value Ref Range   WBC 6.7 4.0 - 10.5 K/uL   RBC 4.65 4.22 - 5.81 Mil/uL   Platelets 392.0 150.0 - 400.0 K/uL   Hemoglobin 14.0 13.0 - 17.0 g/dL   HCT 42.3 39.0 - 52.0 %   MCV 91.0 78.0 - 100.0 fl   MCHC 33.0 30.0 - 36.0 g/dL   RDW 13.2 11.5 - 15.5 %   Comprehensive metabolic panel  Result Value Ref Range   Sodium 139 135 - 145 mEq/L   Potassium 4.4 3.5 - 5.1 mEq/L   Chloride 103 96 - 112 mEq/L   CO2 28 19 - 32 mEq/L   Glucose, Bld 109 (H) 70 - 99 mg/dL   BUN 11 6 - 23 mg/dL   Creatinine, Ser 0.86 0.40 - 1.50 mg/dL   Total  Bilirubin 0.3 0.2 - 1.2 mg/dL   Alkaline Phosphatase 51 39 - 117 U/L   AST 17 0 - 37 U/L   ALT 14 0 - 53 U/L   Total Protein 7.5 6.0 - 8.3 g/dL   Albumin 4.7 3.5 - 5.2 g/dL   Calcium 9.9 8.4 - 10.5 mg/dL   GFR 117.99 >60.00 mL/min  Hemoglobin A1c  Result Value Ref Range   Hgb A1c MFr Bld 5.8 4.6 - 6.5 %  Lipid panel  Result Value Ref Range   Cholesterol 214 (H) 0 - 200 mg/dL   Triglycerides 61.0 0.0 - 149.0 mg/dL   HDL 66.20 >39.00 mg/dL   VLDL 12.2 0.0 - 40.0 mg/dL   LDL Cholesterol 136 (H) 0 - 99 mg/dL   Total CHOL/HDL Ratio 3    NonHDL 148.28   PSA  Result Value Ref Range   PSA 0.62 0.10 - 4.00 ng/mL   Blood counts are normal Metabolic profile looks fine Your hemoglobin A1c (average blood sugar) is in the pre-diabetes range but better,  Your cholesterol is much better! The weight loss is working!   Lab Results      Component                Value               Date                      PSA                      0.62                05/19/2018                PSA                      0.28                05/08/2017                PSA                      0.32                08/24/2015           Your PSA is well within normal range, but has gone up since last year.   Let's plan to repeat a PSA in 3-4 months as a lab visit only. I will order this for you, please come in and have your blood drawn in January/ February.  Otherwise we can plan to visit in 6 months

## 2018-05-19 ENCOUNTER — Encounter: Payer: Self-pay | Admitting: Family Medicine

## 2018-05-19 ENCOUNTER — Ambulatory Visit (INDEPENDENT_AMBULATORY_CARE_PROVIDER_SITE_OTHER): Payer: BLUE CROSS/BLUE SHIELD | Admitting: Family Medicine

## 2018-05-19 VITALS — BP 136/88 | HR 70 | Temp 98.1°F | Resp 16 | Ht 68.0 in | Wt 193.0 lb

## 2018-05-19 DIAGNOSIS — N401 Enlarged prostate with lower urinary tract symptoms: Secondary | ICD-10-CM | POA: Diagnosis not present

## 2018-05-19 DIAGNOSIS — J3089 Other allergic rhinitis: Secondary | ICD-10-CM

## 2018-05-19 DIAGNOSIS — E782 Mixed hyperlipidemia: Secondary | ICD-10-CM

## 2018-05-19 DIAGNOSIS — Z131 Encounter for screening for diabetes mellitus: Secondary | ICD-10-CM

## 2018-05-19 DIAGNOSIS — R350 Frequency of micturition: Secondary | ICD-10-CM | POA: Diagnosis not present

## 2018-05-19 DIAGNOSIS — Z23 Encounter for immunization: Secondary | ICD-10-CM | POA: Diagnosis not present

## 2018-05-19 DIAGNOSIS — Z0000 Encounter for general adult medical examination without abnormal findings: Secondary | ICD-10-CM | POA: Diagnosis not present

## 2018-05-19 DIAGNOSIS — R972 Elevated prostate specific antigen [PSA]: Secondary | ICD-10-CM

## 2018-05-19 DIAGNOSIS — Z2009 Contact with and (suspected) exposure to other intestinal infectious diseases: Secondary | ICD-10-CM | POA: Diagnosis not present

## 2018-05-19 DIAGNOSIS — Z130 Encounter for screening for diseases of the blood and blood-forming organs and certain disorders involving the immune mechanism: Secondary | ICD-10-CM

## 2018-05-19 LAB — COMPREHENSIVE METABOLIC PANEL
ALBUMIN: 4.7 g/dL (ref 3.5–5.2)
ALK PHOS: 51 U/L (ref 39–117)
ALT: 14 U/L (ref 0–53)
AST: 17 U/L (ref 0–37)
BUN: 11 mg/dL (ref 6–23)
CO2: 28 mEq/L (ref 19–32)
CREATININE: 0.86 mg/dL (ref 0.40–1.50)
Calcium: 9.9 mg/dL (ref 8.4–10.5)
Chloride: 103 mEq/L (ref 96–112)
GFR: 117.99 mL/min (ref >60.00)
Glucose, Bld: 109 mg/dL — ABNORMAL HIGH (ref 70–99)
Potassium: 4.4 mEq/L (ref 3.5–5.1)
SODIUM: 139 meq/L (ref 135–145)
TOTAL PROTEIN: 7.5 g/dL (ref 6.0–8.3)
Total Bilirubin: 0.3 mg/dL (ref 0.2–1.2)

## 2018-05-19 LAB — LIPID PANEL
CHOLESTEROL: 214 mg/dL — AB (ref 0–200)
HDL: 66.2 mg/dL (ref >39.00)
LDL CALC: 136 mg/dL — AB (ref 0–99)
NonHDL: 148.28
TRIGLYCERIDES: 61 mg/dL (ref 0.0–149.0)
Total CHOL/HDL Ratio: 3
VLDL: 12.2 mg/dL (ref 0.0–40.0)

## 2018-05-19 LAB — CBC
HEMATOCRIT: 42.3 % (ref 39.0–52.0)
Hemoglobin: 14 g/dL (ref 13.0–17.0)
MCHC: 33 g/dL (ref 30.0–36.0)
MCV: 91 fl (ref 78.0–100.0)
Platelets: 392 10*3/uL (ref 150.0–400.0)
RBC: 4.65 Mil/uL (ref 4.22–5.81)
RDW: 13.2 % (ref 11.5–15.5)
WBC: 6.7 10*3/uL (ref 4.0–10.5)

## 2018-05-19 LAB — PSA: PSA: 0.62 ng/mL (ref 0.10–4.00)

## 2018-05-19 LAB — HEMOGLOBIN A1C: HEMOGLOBIN A1C: 5.8 % (ref 4.6–6.5)

## 2018-05-19 MED ORDER — AZELASTINE HCL 0.1 % NA SOLN: NASAL | 12 refills | Status: DC

## 2018-05-19 MED ORDER — TAMSULOSIN HCL 0.4 MG PO CAPS: 0.4000 mg | ORAL_CAPSULE | Freq: Every day | ORAL | 3 refills | Status: DC

## 2018-05-19 MED ORDER — ALBUTEROL SULFATE HFA 108 (90 BASE) MCG/ACT IN AERS: 2.0000 | INHALATION_SPRAY | Freq: Four times a day (QID) | RESPIRATORY_TRACT | 6 refills | Status: DC | PRN

## 2018-05-19 NOTE — Addendum Note (Signed)
Addended by: Lamar Blinks C on: 05/19/2018 08:23 PM   Modules accepted: Orders

## 2018-05-19 NOTE — Patient Instructions (Signed)
It was good to see you today!  Take care and I will be in touch with you labs asap Flu shot today Continue to exercise and work on weight loss.  You have done great work so far    [employer], Male A healthy lifestyle and preventive care is important for your health and wellness. Ask your health care provider about what schedule of regular examinations is right for you. What should I know about weight and diet? Eat a Healthy Diet  Eat plenty of vegetables, fruits, whole grains, low-fat dairy products, and lean protein.  Do not eat a lot of foods high in solid fats, added sugars, or salt.  Maintain a Healthy Weight Regular exercise can help you achieve or maintain a healthy weight. You should:  Do at least 150 minutes of exercise each week. The exercise should increase your heart rate and make you sweat (moderate-intensity exercise).  Do strength-training exercises at least twice a week.  Watch Your Levels of Cholesterol and Blood Lipids  Have your blood tested for lipids and cholesterol every 5 years starting at 56 years of age. If you are at high risk for heart disease, you should start having your blood tested when you are 56 years old. You may need to have your cholesterol levels checked more often if: ? Your lipid or cholesterol levels are high. ? You are older than 56 years of age. ? You are at high risk for heart disease.  What should I know about cancer screening? Many types of cancers can be detected early and may often be prevented. Lung Cancer  You should be screened every year for lung cancer if: ? You are a current smoker who has smoked for at least 30 years. ? You are a former smoker who has quit within the past 15 years.  Talk to your health care provider about your screening options, when you should start screening, and how often you should be screened.  Colorectal Cancer  Routine colorectal cancer screening usually begins at 56 years of age and should be  repeated every 5-10 years until you are 56 years old. You may need to be screened more often if early forms of precancerous polyps or small growths are found. Your health care provider may recommend screening at an earlier age if you have risk factors for colon cancer.  Your health care provider may recommend using home test kits to check for hidden blood in the stool.  A small camera at the end of a tube can be used to examine your colon (sigmoidoscopy or colonoscopy). This checks for the earliest forms of colorectal cancer.  Prostate and Testicular Cancer  Depending on your age and overall health, your health care provider may do certain tests to screen for prostate and testicular cancer.  Talk to your health care provider about any symptoms or concerns you have about testicular or prostate cancer.  Skin Cancer  Check your skin from head to toe regularly.  Tell your health care provider about any new moles or changes in moles, especially if: ? There is a change in a mole's size, shape, or color. ? You have a mole that is larger than a pencil eraser.  Always use sunscreen. Apply sunscreen liberally and repeat throughout the day.  Protect yourself by wearing long sleeves, pants, a wide-brimmed hat, and sunglasses when outside.  What should I know about heart disease, diabetes, and high blood pressure?  If you are 66-25 years of age, have  your blood pressure checked every 3-5 years. If you are 30 years of age or older, have your blood pressure checked every year. You should have your blood pressure measured twice-once when you are at a hospital or clinic, and once when you are not at a hospital or clinic. Record the average of the two measurements. To check your blood pressure when you are not at a hospital or clinic, you can use: ? An automated blood pressure machine at a pharmacy. ? A home blood pressure monitor.  Talk to your health care provider about your target blood  pressure.  If you are between 84-30 years old, ask your health care provider if you should take aspirin to prevent heart disease.  Have regular diabetes screenings by checking your fasting blood sugar level. ? If you are at a normal weight and have a low risk for diabetes, have this test once every three years after the age of 40. ? If you are overweight and have a high risk for diabetes, consider being tested at a younger age or more often.  A one-time screening for abdominal aortic aneurysm (AAA) by ultrasound is recommended for men aged 67-75 years who are current or former smokers. What should I know about preventing infection? Hepatitis B If you have a higher risk for hepatitis B, you should be screened for this virus. Talk with your health care provider to find out if you are at risk for hepatitis B infection. Hepatitis C Blood testing is recommended for:  Everyone born from [birth year] through 1964.  Anyone with known risk factors for hepatitis C.  Sexually Transmitted Diseases (STDs)  You should be screened each year for STDs including gonorrhea and chlamydia if: ? You are sexually active and are younger than 56 years of age. ? You are older than 56 years of age and your health care provider tells you that you are at risk for this type of infection. ? Your sexual activity has changed since you were last screened and you are at an increased risk for chlamydia or gonorrhea. Ask your health care provider if you are at risk.  Talk with your health care provider about whether you are at high risk of being infected with HIV. Your health care provider may recommend a prescription medicine to help prevent HIV infection.  What else can I do?  Schedule regular health, dental, and eye exams.  Stay current with your vaccines (immunizations).  Do not use any tobacco products, such as cigarettes, chewing tobacco, and e-cigarettes. If you need help quitting, ask your health care  provider.  Limit alcohol intake to no more than 2 drinks per day. One drink equals 12 ounces of beer, 5 ounces of wine, or 1 ounces of hard liquor.  Do not use street drugs.  Do not share needles.  Ask your health care provider for help if you need support or information about quitting drugs.  Tell your health care provider if you often feel depressed.  Tell your health care provider if you have ever been abused or do not feel safe at home. This information is not intended to replace advice given to you by your health care provider. Make sure you discuss any questions you have with your health care provider. Document Released: 12/29/2007 Document Revised: 02/29/2016 Document Reviewed: 04/05/2015 Elsevier Interactive Patient Education  [employer].

## 2018-05-20 LAB — VARICELLA ZOSTER ANTIBODY, IGG: Varicella IgG: 502.4 index

## 2018-06-24 ENCOUNTER — Other Ambulatory Visit: Payer: Self-pay | Admitting: Family Medicine

## 2018-06-24 DIAGNOSIS — N401 Enlarged prostate with lower urinary tract symptoms: Secondary | ICD-10-CM

## 2018-06-24 DIAGNOSIS — R350 Frequency of micturition: Principal | ICD-10-CM

## 2018-08-06 ENCOUNTER — Encounter: Payer: Self-pay | Admitting: Family Medicine

## 2018-09-01 NOTE — Progress Notes (Addendum)
[facility] [address] [place], [place] [address] [phone]  Date:  09/03/2018   Name:  Earl Ruiz   DOB:  04/15/1961   MRN:  [id number]  PCP:  Darreld Mclean, MD    Chief Complaint: Pre-Diabetes (follow up); Lab Work (recheck PSA); and Back Pain (sore ness in lower back, Nature conservation officer, stiffness)   History of Present Illness:  Earl Ruiz is a 58 y.o. very pleasant male patient who presents with the following:  Follow-up visit today-history of prediabetes, OSA, dyslipidemia, obesity He had sent me a message recently about Saxenda for weight loss, and I asked him to come in for a visit.  He has been getting Saxenda from a weight loss clinic in [facility], but this is a long drive for him Otherwise I had recently seen this patient in November for physical  At his last visit his PSA had gone up, we plan to recheck this today A1c checked a few months ago Can offer Shingrix;  He would like to start on this today  Will come in for 2nd dose in 2-6 months   He is still using saxenda, but not always consistently-he is making what he has last He has been on it for about 6 months now  Lost about 10 lbs with consistent use  He notes no particular side effects or other concerns with Saxenda use  No history of thyroid cancer or MEN syndrome, in himself or family  Wt Readings from Last 3 Encounters:  09/03/18 195 lb (88.5 kg)  05/19/18 193 lb (87.5 kg)  05/08/17 202 lb (91.6 kg)     Lab Results  Component Value Date   PSA 0.62 05/19/2018   PSA 0.28 05/08/2017   PSA 0.32 08/24/2015   Lab Results  Component Value Date   HGBA1C 5.8 05/19/2018     Patient Active Problem List   Diagnosis Date Noted  . Pre-diabetes 08/25/2015  . Choking episode occurring at night 05/10/2014  . Snoring 05/10/2014  . Sleep apnea 05/10/2014    Past Medical History:  Diagnosis Date  . Allergy   .  Choking episode occurring at night 05/10/2014  . Sleep apnea 05/10/2014  . Snoring 05/10/2014    Past Surgical History:  Procedure Laterality Date  . no prior surgery      Social History   Tobacco Use  . Smoking status: Never Smoker  . Smokeless tobacco: Never Used  Substance Use Topics  . Alcohol use: No    Comment: occ  . Drug use: No    Family History  Problem Relation Age of Onset  . Diabetes Father   . Colon cancer Neg Hx     No Known Allergies  Medication list has been reviewed and updated.  Current Outpatient Medications on File Prior to Visit  Medication Sig Dispense Refill  . albuterol (PROVENTIL HFA;VENTOLIN HFA) 108 (90 Base) MCG/ACT inhaler Inhale 2 puffs into the lungs every 6 (six) hours as needed for wheezing or shortness of breath. 1 Inhaler 6  . azelastine (ASTELIN) 0.1 % nasal spray PLACE 1 SPRAY INTO BOTH NOSTRILS TWICE A DAY , USE IN EACH NOSTRIL AS DIRECTED 30 mL 12  . cetirizine-pseudoephedrine (ZYRTEC-D) 5-120 MG tablet Take 1 tablet by mouth 2 (two) times daily.    . meloxicam (MOBIC) 15 MG tablet TAKE 1 TAB DAILY AS NEEDED FOR KNEE PAIN 90 tablet 2  . SAXENDA 18  MG/3ML SOPN INJECT 3 MG BY SUBCUTANEOUS ROUTE ONCE DAILY IN THE ABDOMEN THIGH OR UPPER ARM  0  . tamsulosin (FLOMAX) 0.4 MG CAPS capsule Take 1 capsule (0.4 mg total) by mouth daily. 90 capsule 3   No current facility-administered medications on file prior to visit.     Review of Systems:  As per HPI- otherwise negative.   Physical Examination: Vitals:   09/03/18 0954  BP: 126/80  Pulse: 91  Resp: 16  Temp: 98.3 F (36.8 C)  SpO2: 98%   Vitals:   09/03/18 0954  Weight: 195 lb (88.5 kg)  Height: 5\' 8"  (1.727 m)   Body mass index is 29.65 kg/m. Ideal Body Weight: Weight in (lb) to have BMI = 25: 164.1  GEN: WDWN, NAD, Non-toxic, A & O x 3 HEENT: Atraumatic, Normocephalic. Neck supple. No masses, No LAD. Ears and Nose: No external deformity. CV: RRR, No M/G/R. No JVD.  No thrill. No extra heart sounds. PULM: CTA B, no wheezes, crackles, rhonchi. No retractions. No resp. distress. No accessory muscle use. ABD: S, NT, ND, +BS. No rebound. No HSM. EXTR: No c/c/e NEURO Normal gait.  PSYCH: Normally interactive. Conversant. Not depressed or anxious appearing.  Calm demeanor.    Assessment and Plan: Overweight - Plan: Liraglutide -Weight Management (SAXENDA) 18 MG/3ML SOPN  Increased prostate specific antigen (PSA) velocity - Plan: PSA  Pre-diabetes  Immunization due - Plan: Varicella-zoster vaccine IM (Shingrix)  Here today for a couple of issues.  He got his first Shingrix today We are monitoring his PSA for increase in velocity, will recheck for him today Will restart consistent Saxenda use, I have advised patient that he likely will not need to take this very much longer as his weight is now in the overweight category He will come and see me in 2 to 3 months for recheck  Signed Lamar Blinks, MD  Received repeat PSA, as follows Lab Results  Component Value Date   PSA 0.47 09/03/2018   PSA 0.62 05/19/2018   PSA 0.28 05/08/2017    Message to patient

## 2018-09-03 ENCOUNTER — Encounter: Payer: Self-pay | Admitting: Family Medicine

## 2018-09-03 ENCOUNTER — Ambulatory Visit: Payer: BLUE CROSS/BLUE SHIELD | Admitting: Family Medicine

## 2018-09-03 VITALS — BP 126/80 | HR 91 | Temp 98.3°F | Resp 16 | Ht 68.0 in | Wt 195.0 lb

## 2018-09-03 DIAGNOSIS — R972 Elevated prostate specific antigen [PSA]: Secondary | ICD-10-CM | POA: Diagnosis not present

## 2018-09-03 DIAGNOSIS — R7303 Prediabetes: Secondary | ICD-10-CM | POA: Diagnosis not present

## 2018-09-03 DIAGNOSIS — E663 Overweight: Secondary | ICD-10-CM | POA: Diagnosis not present

## 2018-09-03 DIAGNOSIS — Z23 Encounter for immunization: Secondary | ICD-10-CM | POA: Diagnosis not present

## 2018-09-03 LAB — PSA: PSA: 0.47 ng/mL (ref 0.10–4.00)

## 2018-09-03 MED ORDER — LIRAGLUTIDE -WEIGHT MANAGEMENT 18 MG/3ML ~~LOC~~ SOPN: 3.0000 mg | PEN_INJECTOR | Freq: Every day | SUBCUTANEOUS | 2 refills | Status: DC

## 2018-09-03 NOTE — Patient Instructions (Signed)
It was good to see you today!  I will be in touch with your repeat PSA level  You got your first of 2 shingles vaccines today.  Next dose in 2-6 months Please see me in 2-3 months to check on your weight I don't think you will need to stay on saxenda for that long, as you are near goal weight  Remember to increase the Saxenda dose gradually, if you have not been taking it consistently. 0.6 mg daily for 1 week 1.2 mg daily for 1 week 1.8 mg daily for 1 week 2.4 mg daily for 1 week Then goal dose of 3 mg daily

## 2018-09-29 ENCOUNTER — Other Ambulatory Visit: Payer: Self-pay | Admitting: Family Medicine

## 2018-09-29 DIAGNOSIS — G8929 Other chronic pain: Secondary | ICD-10-CM

## 2018-09-29 DIAGNOSIS — M25561 Pain in right knee: Principal | ICD-10-CM

## 2019-02-07 ENCOUNTER — Other Ambulatory Visit: Payer: Self-pay | Admitting: Family Medicine

## 2019-02-07 DIAGNOSIS — E663 Overweight: Secondary | ICD-10-CM

## 2019-02-09 ENCOUNTER — Encounter: Payer: Self-pay | Admitting: Family Medicine

## 2019-02-09 NOTE — Telephone Encounter (Signed)
Does patient need weight check?

## 2019-04-29 ENCOUNTER — Other Ambulatory Visit: Payer: Self-pay | Admitting: Family Medicine

## 2019-04-29 DIAGNOSIS — R350 Frequency of micturition: Secondary | ICD-10-CM

## 2019-04-29 DIAGNOSIS — N401 Enlarged prostate with lower urinary tract symptoms: Secondary | ICD-10-CM

## 2019-04-29 MED ORDER — TAMSULOSIN HCL 0.4 MG PO CAPS: 0.4000 mg | ORAL_CAPSULE | Freq: Every day | ORAL | 0 refills | Status: DC

## 2019-04-29 NOTE — Telephone Encounter (Signed)
Medication Refill - Medication: tamsulosin (FLOMAX) 0.4 MG CAPS capsule  Has the patient contacted their pharmacy? Yes.   (Agent: If no, request that the patient contact the pharmacy for the refill.) (Agent: If yes, when and what did the pharmacy advise?)  Preferred Pharmacy (with phone number or street name): [facility] [address]  Agent: Please be advised that RX refills may take up to 3 business days. We ask that you follow-up with your pharmacy.

## 2019-05-27 NOTE — Progress Notes (Addendum)
[facility] [place], [place], [place] [address] [phone]  Date:  06/01/2019   Name:  Lumumba Loertscher   DOB:  02-25-1961   MRN:  [id number]  PCP:  Darreld Mclean, MD    Chief Complaint: Annual Exam (flu shot)   History of Present Illness:  Earl Ruiz is a 58 y.o. very pleasant male patient who presents with the following:  Here today for routine physical History of prediabetes, snoring, sleep apnea, obesity, dyslipidemia Last seen by myself in February of this year  He has been using [place] for weight loss  Flu vaccine- give today Colonoscopy up-to-date until 2024 Tetanus up-to-date Routine labs due- he is fasting today Second dose of Shingrix due- give today   He ran out of saxenda and stopped using it 2 months ago  Wt Readings from Last 3 Encounters:  06/01/19 190 lb (86.2 kg)  09/03/18 195 lb (88.5 kg)  05/19/18 193 lb (87.5 kg)    Lab Results  Component Value Date   PSA 0.47 09/03/2018   PSA 0.62 05/19/2018   PSA 0.28 05/08/2017    Patient Active Problem List   Diagnosis Date Noted  . Pre-diabetes 08/25/2015  . Choking episode occurring at night 05/10/2014  . Snoring 05/10/2014  . Sleep apnea 05/10/2014    Past Medical History:  Diagnosis Date  . Allergy   . Choking episode occurring at night 05/10/2014  . Sleep apnea 05/10/2014  . Snoring 05/10/2014    Past Surgical History:  Procedure Laterality Date  . no prior surgery      Social History   Tobacco Use  . Smoking status: Never Smoker  . Smokeless tobacco: Never Used  Substance Use Topics  . Alcohol use: No    Comment: occ  . Drug use: No    Family History  Problem Relation Age of Onset  . Diabetes Father   . Colon cancer Neg Hx     No Known Allergies  Medication list has been reviewed and updated.  Current Outpatient Medications on File Prior to Visit  Medication Sig Dispense Refill  . albuterol  (PROVENTIL HFA;VENTOLIN HFA) 108 (90 Base) MCG/ACT inhaler Inhale 2 puffs into the lungs every 6 (six) hours as needed for wheezing or shortness of breath. 1 Inhaler 6  . azelastine (ASTELIN) 0.1 % nasal spray PLACE 1 SPRAY INTO BOTH NOSTRILS TWICE A DAY , USE IN EACH NOSTRIL AS DIRECTED 30 mL 12  . cetirizine-pseudoephedrine (ZYRTEC-D) 5-120 MG tablet Take 1 tablet by mouth 2 (two) times daily.    . Liraglutide -Weight Management (SAXENDA) 18 MG/3ML SOPN Inject 3 mg into the skin daily. Start with 0.6 daily for one week, increase by 0.6 weekly until goal dose 5 pen 2  . meloxicam (MOBIC) 15 MG tablet TAKE 1 TABLET BY MOUTH ONCE DAILY AS NEEDED FOR  KNEE  PAIN 30 tablet 3  . tamsulosin (FLOMAX) 0.4 MG CAPS capsule Take 1 capsule (0.4 mg total) by mouth daily. 90 capsule 0   No current facility-administered medications on file prior to visit.     Review of Systems:  As per HPI- otherwise negative. No CP or SOB   Physical Examination: Vitals:   06/01/19 1321  BP: 124/80  Pulse: 80  Resp: 16  Temp: (!) 96.9 F (36.1 C)  SpO2: 99%   Vitals:   06/01/19 1321  Weight: 190 lb (86.2 kg)  Height: 5\' 8"  (1.727  m)   Body mass index is 28.89 kg/m. Ideal Body Weight: Weight in (lb) to have BMI = 25: 164.1  GEN: WDWN, NAD, Non-toxic, A & O x 3, overweight, looks well  HEENT: Atraumatic, Normocephalic. Neck supple. No masses, No LAD.  TM wnl  Ears and Nose: No external deformity. CV: RRR, No M/G/R. No JVD. No thrill. No extra heart sounds. PULM: CTA B, no wheezes, crackles, rhonchi. No retractions. No resp. distress. No accessory muscle use. ABD: S, NT, ND, +BS. No rebound. No HSM. EXTR: No c/c/e NEURO Normal gait.  PSYCH: Normally interactive. Conversant. Not depressed or anxious appearing.  Calm demeanor.    Assessment and Plan: Physical exam  Pre-diabetes - Plan: Comprehensive metabolic panel, Hemoglobin A1c  Mixed hyperlipidemia - Plan: Lipid panel  Benign prostatic  hyperplasia with urinary frequency - Plan: PSA  Screening for HIV (human immunodeficiency virus) - Plan: HIV antibody  Screening for deficiency anemia - Plan: CBC  Exposure to COVID-19 virus - Plan: SAR CoV2 Serology (COVID 19)AB(IGG)IA  Immunization due - Plan: Varicella-zoster vaccine IM (Shingrix), Flu Vaccine QUAD 6+ mos PF IM (Fluarix Quad PF)  Overweight - Plan: Liraglutide -Weight Management (SAXENDA) 18 MG/3ML SOPN  CPE today Flu shot, 2nd shingles vaccine His BMI is over 27 and he does have dyslipidemia so may continue saxenda- rx written today, he will titrate back onto medication as directed.  Advised once BMI is 27 he will no longer need to continue medication  Will plan further follow- up pending labs.   Signed Lamar Blinks, MD   Received his labs, message to pt  Results for orders placed or performed in visit on 06/01/19  CBC  Result Value Ref Range   WBC 6.0 4.0 - 10.5 K/uL   RBC 4.36 4.22 - 5.81 Mil/uL   Platelets 343.0 150.0 - 400.0 K/uL   Hemoglobin 13.1 13.0 - 17.0 g/dL   HCT 39.6 39.0 - 52.0 %   MCV 90.9 78.0 - 100.0 fl   MCHC 33.0 30.0 - 36.0 g/dL   RDW 13.1 11.5 - 15.5 %  Comprehensive metabolic panel  Result Value Ref Range   Sodium 139 135 - 145 mEq/L   Potassium 4.1 3.5 - 5.1 mEq/L   Chloride 104 96 - 112 mEq/L   CO2 27 19 - 32 mEq/L   Glucose, Bld 105 (H) 70 - 99 mg/dL   BUN 12 6 - 23 mg/dL   Creatinine, Ser 0.80 0.40 - 1.50 mg/dL   Total Bilirubin 0.4 0.2 - 1.2 mg/dL   Alkaline Phosphatase 53 39 - 117 U/L   AST 17 0 - 37 U/L   ALT 17 0 - 53 U/L   Total Protein 7.4 6.0 - 8.3 g/dL   Albumin 4.7 3.5 - 5.2 g/dL   GFR 120.23 >60.00 mL/min   Calcium 9.6 8.4 - 10.5 mg/dL  Hemoglobin A1c  Result Value Ref Range   Hgb A1c MFr Bld 5.8 4.6 - 6.5 %  Lipid panel  Result Value Ref Range   Cholesterol 232 (H) 0 - 200 mg/dL   Triglycerides 53.0 0.0 - 149.0 mg/dL   HDL 58.70 >39.00 mg/dL   VLDL 10.6 0.0 - 40.0 mg/dL   LDL Cholesterol 163 (H) 0  - 99 mg/dL   Total CHOL/HDL Ratio 4    NonHDL 173.62   PSA  Result Value Ref Range   PSA 0.34 0.10 - 4.00 ng/mL   Blood counts are normal Metabolic profile looks ok A1c- average blood sugar  over the previous 3 months- is in the pre-diabetes range. Stable/ improved from previous. We will continue to monitor  PSA is less then last year- always reassuring. Continue to monitor Lab Results  Component Value Date   PSA 0.34 06/01/2019   PSA 0.47 09/03/2018   PSA 0.62 05/19/2018    Your cholesterol is a bit high, and your 10 year estimated risk of cardiovascular disease is elevated:  The 10-year ASCVD risk score Mikey Bussing DC Brooke Bonito., et al., 2013) is: 12.6%   Values used to calculate the score:     Age: 38 years     Sex: Male     Is Non-Hispanic African American: Yes     Diabetic: Yes     Tobacco smoker: No     Systolic Blood Pressure: [id number] mmHg     Is BP treated: No     HDL Cholesterol: 58.7 mg/dL     Total Cholesterol: 232 mg/dL  I would suggest that we start you on a cholesterol mediatation to lower this risk.  Would this be ok with you?

## 2019-05-27 NOTE — Patient Instructions (Addendum)
It was good to see you again today, I will be in touch with your labs ASAP You got your flu shot and 2nd shingles vaccine today You will need to re-titrate your saxenda since you have been off it for a few months.  However, once you lose a few more pounds (BMI to 27) you will no longer need to use it.  Continue to work on diet and exercise as well    Health Maintenance, Male Adopting a healthy lifestyle and getting preventive care are important in promoting health and wellness. Ask your health care provider about:  The right schedule for you to have regular tests and exams.  Things you can do on your own to prevent diseases and keep yourself healthy. What should I know about diet, weight, and exercise? Eat a healthy diet   Eat a diet that includes plenty of vegetables, fruits, low-fat dairy products, and lean protein.  Do not eat a lot of foods that are high in solid fats, added sugars, or sodium. Maintain a healthy weight Body mass index (BMI) is a measurement that can be used to identify possible weight problems. It estimates body fat based on height and weight. Your health care provider can help determine your BMI and help you achieve or maintain a healthy weight. Get regular exercise Get regular exercise. This is one of the most important things you can do for your health. Most adults should:  Exercise for at least 150 minutes each week. The exercise should increase your heart rate and make you sweat (moderate-intensity exercise).  Do strengthening exercises at least twice a week. This is in addition to the moderate-intensity exercise.  Spend less time sitting. Even light physical activity can be beneficial. Watch cholesterol and blood lipids Have your blood tested for lipids and cholesterol at 57 years of age, then have this test every 5 years. You may need to have your cholesterol levels checked more often if:  Your lipid or cholesterol levels are high.  You are older than 56  years of age.  You are at high risk for heart disease. What should I know about cancer screening? Many types of cancers can be detected early and may often be prevented. Depending on your health history and family history, you may need to have cancer screening at various ages. This may include screening for:  Colorectal cancer.  Prostate cancer.  Skin cancer.  Lung cancer. What should I know about heart disease, diabetes, and high blood pressure? Blood pressure and heart disease  High blood pressure causes heart disease and increases the risk of stroke. This is more likely to develop in people who have high blood pressure readings, are of African descent, or are overweight.  Talk with your health care provider about your target blood pressure readings.  Have your blood pressure checked: ? Every 3-5 years if you are 62-6 years of age. ? Every year if you are 65 years old or older.  If you are between the ages of 48 and 62 and are a current or former smoker, ask your health care provider if you should have a one-time screening for abdominal aortic aneurysm (AAA). Diabetes Have regular diabetes screenings. This checks your fasting blood sugar level. Have the screening done:  Once every three years after age 39 if you are at a normal weight and have a low risk for diabetes.  More often and at a younger age if you are overweight or have a high risk for diabetes.  What should I know about preventing infection? Hepatitis B If you have a higher risk for hepatitis B, you should be screened for this virus. Talk with your health care provider to find out if you are at risk for hepatitis B infection. Hepatitis C Blood testing is recommended for:  Everyone born from [birth year] through 1965.  Anyone with known risk factors for hepatitis C. Sexually transmitted infections (STIs)  You should be screened each year for STIs, including gonorrhea and chlamydia, if: ? You are sexually active and are  younger than 58 years of age. ? You are older than 57 years of age and your health care provider tells you that you are at risk for this type of infection. ? Your sexual activity has changed since you were last screened, and you are at increased risk for chlamydia or gonorrhea. Ask your health care provider if you are at risk.  Ask your health care provider about whether you are at high risk for HIV. Your health care provider may recommend a prescription medicine to help prevent HIV infection. If you choose to take medicine to prevent HIV, you should first get tested for HIV. You should then be tested every 3 months for as long as you are taking the medicine. Follow these instructions at home: Lifestyle  Do not use any products that contain nicotine or tobacco, such as cigarettes, e-cigarettes, and chewing tobacco. If you need help quitting, ask your health care provider.  Do not use street drugs.  Do not share needles.  Ask your health care provider for help if you need support or information about quitting drugs. Alcohol use  Do not drink alcohol if your health care provider tells you not to drink.  If you drink alcohol: ? Limit how much you have to 0-2 drinks a day. ? Be aware of how much alcohol is in your drink. In the [place] one drink equals one 12 oz bottle of beer (355 mL), one 5 oz glass of wine (148 mL), or one 1 oz glass of hard liquor (44 mL). General instructions  Schedule regular health, dental, and eye exams.  Stay current with your vaccines.  Tell your health care provider if: ? You often feel depressed. ? You have ever been abused or do not feel safe at home. Summary  Adopting a healthy lifestyle and getting preventive care are important in promoting health and wellness.  Follow your health care provider's instructions about healthy diet, exercising, and getting tested or screened for diseases.  Follow your health care provider's instructions on monitoring your  cholesterol and blood pressure. This information is not intended to replace advice given to you by your health care provider. Make sure you discuss any questions you have with your health care provider. Document Released: 12/29/2007 Document Revised: 06/25/2018 Document Reviewed: 06/25/2018 Elsevier Patient Education  2020 [employer].

## 2019-06-01 ENCOUNTER — Encounter: Payer: Self-pay | Admitting: Family Medicine

## 2019-06-01 ENCOUNTER — Ambulatory Visit (INDEPENDENT_AMBULATORY_CARE_PROVIDER_SITE_OTHER): Payer: BC Managed Care – PPO | Admitting: Family Medicine

## 2019-06-01 ENCOUNTER — Other Ambulatory Visit: Payer: Self-pay

## 2019-06-01 VITALS — BP 124/80 | HR 80 | Temp 96.9°F | Resp 16 | Ht 68.0 in | Wt 190.0 lb

## 2019-06-01 DIAGNOSIS — Z130 Encounter for screening for diseases of the blood and blood-forming organs and certain disorders involving the immune mechanism: Secondary | ICD-10-CM | POA: Diagnosis not present

## 2019-06-01 DIAGNOSIS — E663 Overweight: Secondary | ICD-10-CM

## 2019-06-01 DIAGNOSIS — R7303 Prediabetes: Secondary | ICD-10-CM

## 2019-06-01 DIAGNOSIS — R350 Frequency of micturition: Secondary | ICD-10-CM | POA: Diagnosis not present

## 2019-06-01 DIAGNOSIS — Z20828 Contact with and (suspected) exposure to other viral communicable diseases: Secondary | ICD-10-CM

## 2019-06-01 DIAGNOSIS — E782 Mixed hyperlipidemia: Secondary | ICD-10-CM

## 2019-06-01 DIAGNOSIS — Z23 Encounter for immunization: Secondary | ICD-10-CM

## 2019-06-01 DIAGNOSIS — Z20822 Contact with and (suspected) exposure to covid-19: Secondary | ICD-10-CM

## 2019-06-01 DIAGNOSIS — N401 Enlarged prostate with lower urinary tract symptoms: Secondary | ICD-10-CM

## 2019-06-01 DIAGNOSIS — Z0000 Encounter for general adult medical examination without abnormal findings: Secondary | ICD-10-CM | POA: Diagnosis not present

## 2019-06-01 DIAGNOSIS — Z114 Encounter for screening for human immunodeficiency virus [HIV]: Secondary | ICD-10-CM | POA: Diagnosis not present

## 2019-06-01 LAB — LIPID PANEL
Cholesterol: 232 mg/dL — ABNORMAL HIGH (ref 0–200)
HDL: 58.7 mg/dL (ref >39.00)
LDL Cholesterol: 163 mg/dL — ABNORMAL HIGH (ref 0–99)
NonHDL: 173.62
Total CHOL/HDL Ratio: 4
Triglycerides: 53 mg/dL (ref 0.0–149.0)
VLDL: 10.6 mg/dL (ref 0.0–40.0)

## 2019-06-01 LAB — COMPREHENSIVE METABOLIC PANEL
ALT: 17 U/L (ref 0–53)
AST: 17 U/L (ref 0–37)
Albumin: 4.7 g/dL (ref 3.5–5.2)
Alkaline Phosphatase: 53 U/L (ref 39–117)
BUN: 12 mg/dL (ref 6–23)
CO2: 27 mEq/L (ref 19–32)
Calcium: 9.6 mg/dL (ref 8.4–10.5)
Chloride: 104 mEq/L (ref 96–112)
Creatinine, Ser: 0.8 mg/dL (ref 0.40–1.50)
GFR: 120.23 mL/min (ref >60.00)
Glucose, Bld: 105 mg/dL — ABNORMAL HIGH (ref 70–99)
Potassium: 4.1 mEq/L (ref 3.5–5.1)
Sodium: 139 mEq/L (ref 135–145)
Total Bilirubin: 0.4 mg/dL (ref 0.2–1.2)
Total Protein: 7.4 g/dL (ref 6.0–8.3)

## 2019-06-01 LAB — CBC
HCT: 39.6 % (ref 39.0–52.0)
Hemoglobin: 13.1 g/dL (ref 13.0–17.0)
MCHC: 33 g/dL (ref 30.0–36.0)
MCV: 90.9 fl (ref 78.0–100.0)
Platelets: 343 10*3/uL (ref 150.0–400.0)
RBC: 4.36 Mil/uL (ref 4.22–5.81)
RDW: 13.1 % (ref 11.5–15.5)
WBC: 6 10*3/uL (ref 4.0–10.5)

## 2019-06-01 LAB — PSA: PSA: 0.34 ng/mL (ref 0.10–4.00)

## 2019-06-01 LAB — HEMOGLOBIN A1C: Hgb A1c MFr Bld: 5.8 % (ref 4.6–6.5)

## 2019-06-01 MED ORDER — SAXENDA 18 MG/3ML ~~LOC~~ SOPN: 3.0000 mg | PEN_INJECTOR | Freq: Every day | SUBCUTANEOUS | 2 refills | Status: DC

## 2019-06-02 ENCOUNTER — Encounter: Payer: Self-pay | Admitting: Family Medicine

## 2019-06-02 LAB — HIV ANTIBODY (ROUTINE TESTING W REFLEX): HIV 1&2 Ab, 4th Generation: NONREACTIVE

## 2019-06-02 LAB — SAR COV2 SEROLOGY (COVID19)AB(IGG),IA: SARS CoV2 AB IGG: NEGATIVE

## 2019-06-03 ENCOUNTER — Other Ambulatory Visit: Payer: Self-pay | Admitting: Family Medicine

## 2019-06-03 DIAGNOSIS — J3089 Other allergic rhinitis: Secondary | ICD-10-CM

## 2019-06-09 MED ORDER — INSULIN PEN NEEDLE 32G X 4 MM MISC: 1 refills | Status: DC

## 2019-06-09 NOTE — Telephone Encounter (Signed)
Patient notified that rx has been sent in. 

## 2019-06-09 NOTE — Addendum Note (Signed)
Addended by: Kem Boroughs D on: 06/09/2019 02:19 PM   Modules accepted: Orders

## 2019-06-09 NOTE — Telephone Encounter (Signed)
Patient states she needs needles with this medication, patient states pharmacy is waiting on the needle prescription.   Call back [phone]

## 2019-06-30 ENCOUNTER — Encounter: Payer: Self-pay | Admitting: Family Medicine

## 2019-07-07 ENCOUNTER — Other Ambulatory Visit: Payer: Self-pay

## 2019-07-07 ENCOUNTER — Ambulatory Visit: Payer: BC Managed Care – PPO | Attending: Internal Medicine

## 2019-07-07 DIAGNOSIS — Z20822 Contact with and (suspected) exposure to covid-19: Secondary | ICD-10-CM

## 2019-07-07 DIAGNOSIS — Z20828 Contact with and (suspected) exposure to other viral communicable diseases: Secondary | ICD-10-CM | POA: Diagnosis not present

## 2019-07-09 LAB — NOVEL CORONAVIRUS, NAA: SARS-CoV-2, NAA: NOT DETECTED

## 2019-07-21 ENCOUNTER — Other Ambulatory Visit: Payer: Self-pay | Admitting: Family Medicine

## 2019-07-21 DIAGNOSIS — N401 Enlarged prostate with lower urinary tract symptoms: Secondary | ICD-10-CM

## 2019-08-18 ENCOUNTER — Other Ambulatory Visit: Payer: Self-pay | Admitting: Family Medicine

## 2019-08-18 DIAGNOSIS — J3089 Other allergic rhinitis: Secondary | ICD-10-CM

## 2019-11-23 ENCOUNTER — Other Ambulatory Visit: Payer: Self-pay | Admitting: Family Medicine

## 2019-11-23 DIAGNOSIS — G8929 Other chronic pain: Secondary | ICD-10-CM

## 2019-12-29 NOTE — Progress Notes (Addendum)
[place] [facility] [address] [place], [place] [address] [phone]  Date:  01/04/2020   Name:  Earl Ruiz   DOB:  07-12-1961   MRN:  [id number]  PCP:  Darreld Mclean, MD    Chief Complaint: Annual Exam   History of Present Illness:  Earl Ruiz is a 59 y.o. very pleasant male patient who presents with the following:  Patient with history of sleep apnea and prediabetes, obesity, dyslipidemia.  Here today for routine physical Last seen by myself November 2020 At that time he went back on Saxenda after 39-month break for weight loss-he is not currently taking [place]  He notes that he is under some stress today- he runs a [employer], [employer].  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  They have had difficulty finding employees and his crew did not show up today, we think this may be why his blood pressure is up He checks his BP at [facility] on a regular basis and he tends to run 130s/70s  COVID-19 series- done  Colonoscopy up-to-date Shingrix done Labs done November, A1c 5.8%- he is fasting today   Never smoker, no drugs, occasional alcohol  Wt Readings from Last 3 Encounters:  01/04/20 194 lb 8 oz (88.2 kg)  06/01/19 190 lb (86.2 kg)  09/03/18 195 lb (88.5 kg)    Patient Active Problem List   Diagnosis Date Noted  . Pre-diabetes 08/25/2015  . Choking episode occurring at night 05/10/2014  . Snoring 05/10/2014  . Sleep apnea 05/10/2014    Past Medical History:  Diagnosis Date  . Allergy   . Choking episode occurring at night 05/10/2014  . Sleep apnea 05/10/2014  . Snoring 05/10/2014    Past Surgical History:  Procedure Laterality Date  . no prior surgery      Social History   Tobacco Use  . Smoking status: Never Smoker  . Smokeless tobacco: Never Used  Substance Use Topics  . Alcohol use: No    Comment: occ  . Drug use: No    Family History  Problem  Relation Age of Onset  . Diabetes Father   . Colon cancer Neg Hx     No Known Allergies  Medication list has been reviewed and updated.  Current Outpatient Medications on File Prior to Visit  Medication Sig Dispense Refill  . albuterol (PROVENTIL HFA;VENTOLIN HFA) 108 (90 Base) MCG/ACT inhaler Inhale 2 puffs into the lungs every 6 (six) hours as needed for wheezing or shortness of breath. 1 Inhaler 6  . azelastine (ASTELIN) 0.1 % nasal spray USE 1 SPRAY(S) IN EACH NOSTRIL TWICE DAILY AS DIRECTED 30 mL 5  . cetirizine-pseudoephedrine (ZYRTEC-D) 5-120 MG tablet Take 1 tablet by mouth 2 (two) times daily.    . Insulin Pen Needle 32G X 4 MM MISC Use as directed with Saxenda daily. 100 each 1  . Liraglutide -Weight Management (SAXENDA) 18 MG/3ML SOPN Inject 3 mg into the skin daily. Start with 0.6 daily for one week, increase by 0.6 weekly until goal dose 5 pen 2  . meloxicam (MOBIC) 15 MG tablet TAKE 1 TABLET BY MOUTH ONCE DAILY AS NEEDED FOR  KNEE  PAIN 30 tablet 1  . tamsulosin (FLOMAX) 0.4 MG CAPS capsule TAKE 1 CAPSULE BY MOUTH EVERY DAY 90 capsule 1   No current facility-administered medications on file prior to visit.    Review of Systems:  As per HPI-  otherwise negative.   Physical Examination: Vitals:   01/04/20 0832  BP: (!) 146/98  Pulse: 69  Resp: 18  Temp: (!) 97 F (36.1 C)  SpO2: 100%   Vitals:   01/04/20 0832  Weight: 194 lb 8 oz (88.2 kg)  Height: 5\' 7"  (1.702 m)   Body mass index is 30.46 kg/m. Ideal Body Weight: Weight in (lb) to have BMI = 25: 159.3  GEN: no acute distress.  Mildly obese, looks well  HEENT: Atraumatic, Normocephalic. Bilateral TM wnl, oropharynx normal.  PEERL,EOMI.   Ears and Nose: No external deformity. CV: RRR, No M/G/R. No JVD. No thrill. No extra heart sounds. PULM: CTA B, no wheezes, crackles, rhonchi. No retractions. No resp. distress. No accessory muscle use. ABD: S, NT, ND. No rebound. No HSM. EXTR: No c/c/e PSYCH: Normally  interactive. Conversant.    Assessment and Plan: Physical exam  Pre-diabetes - Plan: Comprehensive metabolic panel, Hemoglobin A1c  Mixed hyperlipidemia - Plan: Lipid panel  Benign prostatic hyperplasia with urinary frequency - Plan: PSA  Screening for deficiency anemia - Plan: CBC  Overweight   Here today for routine physical.  Overall doing well.  He has not been exercising quite as much due to the pandemic, plans to go back to the gym.  Blood pressure slightly high today but he is feeling stressed.  I asked him to continue to monitor his blood pressure at home, he will let me know if running high  Will plan further follow- up pending labs.  This visit occurred during the SARS-CoV-2 public health emergency.  Safety protocols were in place, including screening questions prior to the visit, additional usage of staff PPE, and extensive cleaning of exam room while observing appropriate contact time as indicated for disinfecting solutions.    Signed Lamar Blinks, MD  Received his labs as below, message to patient A1c stable to improved Mild anemia is new-we will order CBC to recheck in 2 months Results for orders placed or performed in visit on 01/04/20  CBC  Result Value Ref Range   WBC 6.3 4.0 - 10.5 K/uL   RBC 4.20 (L) 4.22 - 5.81 Mil/uL   Platelets 321.0 150 - 400 K/uL   Hemoglobin 12.6 (L) 13.0 - 17.0 g/dL   HCT 38.6 (L) 39 - 52 %   MCV 92.1 78.0 - 100.0 fl   MCHC 32.6 30.0 - 36.0 g/dL   RDW 13.1 11.5 - 15.5 %  Comprehensive metabolic panel  Result Value Ref Range   Sodium 138 135 - 145 mEq/L   Potassium 4.3 3.5 - 5.1 mEq/L   Chloride 106 96 - 112 mEq/L   CO2 25 19 - 32 mEq/L   Glucose, Bld 100 (H) 70 - 99 mg/dL   BUN 18 6 - 23 mg/dL   Creatinine, Ser 0.72 0.40 - 1.50 mg/dL   Total Bilirubin 0.2 0.2 - 1.2 mg/dL   Alkaline Phosphatase 46 39 - 117 U/L   AST 18 0 - 37 U/L   ALT 18 0 - 53 U/L   Total Protein 6.7 6.0 - 8.3 g/dL   Albumin 4.3 3.5 - 5.2 g/dL    GFR 135.49 >60.00 mL/min   Calcium 9.0 8.4 - 10.5 mg/dL  Hemoglobin A1c  Result Value Ref Range   Hgb A1c MFr Bld 5.7 4.6 - 6.5 %  Lipid panel  Result Value Ref Range   Cholesterol 203 (H) 0 - 200 mg/dL   Triglycerides 75.0 0 - 149 mg/dL  HDL 56.20 >39.00 mg/dL   VLDL 15.0 0.0 - 40.0 mg/dL   LDL Cholesterol 131 (H) 0 - 99 mg/dL   Total CHOL/HDL Ratio 4    NonHDL 146.44   PSA  Result Value Ref Range   PSA 0.24 0.10 - 4.00 ng/mL   Lab Results  Component Value Date   PSA 0.24 01/04/2020   PSA 0.34 06/01/2019   PSA 0.47 09/03/2018

## 2020-01-03 NOTE — Patient Instructions (Signed)
It was good to see you again today I will be in touch with your labs asap  Work on getting back into the gym as you feel comfortable Your BP is a bit high today- continue [facility].   If you notice numbers higher than 135/90 on a regular basis please let me know    Health Maintenance, Male Adopting a healthy lifestyle and getting preventive care are important in promoting health and wellness. Ask your health care provider about:  The right schedule for you to have regular tests and exams.  Things you can do on your own to prevent diseases and keep yourself healthy. What should I know about diet, weight, and exercise? Eat a healthy diet   Eat a diet that includes plenty of vegetables, fruits, low-fat dairy products, and lean protein.  Do not eat a lot of foods that are high in solid fats, added sugars, or sodium. Maintain a healthy weight Body mass index (BMI) is a measurement that can be used to identify possible weight problems. It estimates body fat based on height and weight. Your health care provider can help determine your BMI and help you achieve or maintain a healthy weight. Get regular exercise Get regular exercise. This is one of the most important things you can do for your health. Most adults should:  Exercise for at least 150 minutes each week. The exercise should increase your heart rate and make you sweat (moderate-intensity exercise).  Do strengthening exercises at least twice a week. This is in addition to the moderate-intensity exercise.  Spend less time sitting. Even light physical activity can be beneficial. Watch cholesterol and blood lipids Have your blood tested for lipids and cholesterol at 58 years of age, then have this test every 5 years. You may need to have your cholesterol levels checked more often if:  Your lipid or cholesterol levels are high.  You are older than 58 years of age.  You are at high risk for heart disease. What should I  know about cancer screening? Many types of cancers can be detected early and may often be prevented. Depending on your health history and family history, you may need to have cancer screening at various ages. This may include screening for:  Colorectal cancer.  Prostate cancer.  Skin cancer.  Lung cancer. What should I know about heart disease, diabetes, and high blood pressure? Blood pressure and heart disease  High blood pressure causes heart disease and increases the risk of stroke. This is more likely to develop in people who have high blood pressure readings, are of African descent, or are overweight.  Talk with your health care provider about your target blood pressure readings.  Have your blood pressure checked: ? Every 3-5 years if you are 102-38 years of age. ? Every year if you are 78 years old or older.  If you are between the ages of 2 and 21 and are a current or former smoker, ask your health care provider if you should have a one-time screening for abdominal aortic aneurysm (AAA). Diabetes Have regular diabetes screenings. This checks your fasting blood sugar level. Have the screening done:  Once every three years after age 60 if you are at a normal weight and have a low risk for diabetes.  More often and at a younger age if you are overweight or have a high risk for diabetes. What should I know about preventing infection? Hepatitis B If you have a higher risk  for hepatitis B, you should be screened for this virus. Talk with your health care provider to find out if you are at risk for hepatitis B infection. Hepatitis C Blood testing is recommended for:  Everyone born from [birth year] through 1965.  Anyone with known risk factors for hepatitis C. Sexually transmitted infections (STIs)  You should be screened each year for STIs, including gonorrhea and chlamydia, if: ? You are sexually active and are younger than 58 years of age. ? You are older than 58 years of age and  your health care provider tells you that you are at risk for this type of infection. ? Your sexual activity has changed since you were last screened, and you are at increased risk for chlamydia or gonorrhea. Ask your health care provider if you are at risk.  Ask your health care provider about whether you are at high risk for HIV. Your health care provider may recommend a prescription medicine to help prevent HIV infection. If you choose to take medicine to prevent HIV, you should first get tested for HIV. You should then be tested every 3 months for as long as you are taking the medicine. Follow these instructions at home: Lifestyle  Do not use any products that contain nicotine or tobacco, such as cigarettes, e-cigarettes, and chewing tobacco. If you need help quitting, ask your health care provider.  Do not use street drugs.  Do not share needles.  Ask your health care provider for help if you need support or information about quitting drugs. Alcohol use  Do not drink alcohol if your health care provider tells you not to drink.  If you drink alcohol: ? Limit how much you have to 0-2 drinks a day. ? Be aware of how much alcohol is in your drink. In the [place] one drink equals one 12 oz bottle of beer (355 mL), one 5 oz glass of wine (148 mL), or one 1 oz glass of hard liquor (44 mL). General instructions  Schedule regular health, dental, and eye exams.  Stay current with your vaccines.  Tell your health care provider if: ? You often feel depressed. ? You have ever been abused or do not feel safe at home. Summary  Adopting a healthy lifestyle and getting preventive care are important in promoting health and wellness.  Follow your health care provider's instructions about healthy diet, exercising, and getting tested or screened for diseases.  Follow your health care provider's instructions on monitoring your cholesterol and blood pressure. This information is not intended to  replace advice given to you by your health care provider. Make sure you discuss any questions you have with your health care provider. Document Revised: 06/25/2018 Document Reviewed: 06/25/2018 Elsevier Patient Education  2020 [employer].

## 2020-01-04 ENCOUNTER — Other Ambulatory Visit: Payer: Self-pay

## 2020-01-04 ENCOUNTER — Encounter: Payer: Self-pay | Admitting: Family Medicine

## 2020-01-04 ENCOUNTER — Ambulatory Visit (INDEPENDENT_AMBULATORY_CARE_PROVIDER_SITE_OTHER): Payer: BC Managed Care – PPO | Admitting: Family Medicine

## 2020-01-04 VITALS — BP 146/98 | HR 69 | Temp 97.0°F | Resp 18 | Ht 67.0 in | Wt 194.5 lb

## 2020-01-04 DIAGNOSIS — R350 Frequency of micturition: Secondary | ICD-10-CM | POA: Diagnosis not present

## 2020-01-04 DIAGNOSIS — Z130 Encounter for screening for diseases of the blood and blood-forming organs and certain disorders involving the immune mechanism: Secondary | ICD-10-CM | POA: Diagnosis not present

## 2020-01-04 DIAGNOSIS — R7303 Prediabetes: Secondary | ICD-10-CM | POA: Diagnosis not present

## 2020-01-04 DIAGNOSIS — E663 Overweight: Secondary | ICD-10-CM

## 2020-01-04 DIAGNOSIS — E785 Hyperlipidemia, unspecified: Secondary | ICD-10-CM

## 2020-01-04 DIAGNOSIS — E782 Mixed hyperlipidemia: Secondary | ICD-10-CM | POA: Diagnosis not present

## 2020-01-04 DIAGNOSIS — Z0000 Encounter for general adult medical examination without abnormal findings: Secondary | ICD-10-CM

## 2020-01-04 DIAGNOSIS — N401 Enlarged prostate with lower urinary tract symptoms: Secondary | ICD-10-CM | POA: Diagnosis not present

## 2020-01-04 DIAGNOSIS — D649 Anemia, unspecified: Secondary | ICD-10-CM

## 2020-01-04 LAB — COMPREHENSIVE METABOLIC PANEL
ALT: 18 U/L (ref 0–53)
AST: 18 U/L (ref 0–37)
Albumin: 4.3 g/dL (ref 3.5–5.2)
Alkaline Phosphatase: 46 U/L (ref 39–117)
BUN: 18 mg/dL (ref 6–23)
CO2: 25 mEq/L (ref 19–32)
Calcium: 9 mg/dL (ref 8.4–10.5)
Chloride: 106 mEq/L (ref 96–112)
Creatinine, Ser: 0.72 mg/dL (ref 0.40–1.50)
GFR: 135.49 mL/min (ref >60.00)
Glucose, Bld: 100 mg/dL — ABNORMAL HIGH (ref 70–99)
Potassium: 4.3 mEq/L (ref 3.5–5.1)
Sodium: 138 mEq/L (ref 135–145)
Total Bilirubin: 0.2 mg/dL (ref 0.2–1.2)
Total Protein: 6.7 g/dL (ref 6.0–8.3)

## 2020-01-04 LAB — LIPID PANEL
Cholesterol: 203 mg/dL — ABNORMAL HIGH (ref 0–200)
HDL: 56.2 mg/dL (ref >39.00)
LDL Cholesterol: 131 mg/dL — ABNORMAL HIGH (ref 0–99)
NonHDL: 146.44
Total CHOL/HDL Ratio: 4
Triglycerides: 75 mg/dL (ref 0.0–149.0)
VLDL: 15 mg/dL (ref 0.0–40.0)

## 2020-01-04 LAB — CBC
HCT: 38.6 % — ABNORMAL LOW (ref 39.0–52.0)
Hemoglobin: 12.6 g/dL — ABNORMAL LOW (ref 13.0–17.0)
MCHC: 32.6 g/dL (ref 30.0–36.0)
MCV: 92.1 fl (ref 78.0–100.0)
Platelets: 321 10*3/uL (ref 150.0–400.0)
RBC: 4.2 Mil/uL — ABNORMAL LOW (ref 4.22–5.81)
RDW: 13.1 % (ref 11.5–15.5)
WBC: 6.3 10*3/uL (ref 4.0–10.5)

## 2020-01-04 LAB — HEMOGLOBIN A1C: Hgb A1c MFr Bld: 5.7 % (ref 4.6–6.5)

## 2020-01-04 LAB — PSA: PSA: 0.24 ng/mL (ref 0.10–4.00)

## 2020-01-04 NOTE — Addendum Note (Signed)
Addended by: Lamar Blinks C on: 01/04/2020 12:51 PM   Modules accepted: Orders

## 2020-01-07 MED ORDER — ROSUVASTATIN CALCIUM 20 MG PO TABS: 20.0000 mg | ORAL_TABLET | Freq: Every day | ORAL | 3 refills | Status: DC

## 2020-01-07 NOTE — Telephone Encounter (Signed)
I gave him a call back.  He did not get my lab notes for some reason- I will send to him again, but we discussed my recommendations.  He is happy to go on a cholesterol medication, I called in Crestor 20 mg.  I also asked him to come in for a lab visit only in about 2 months to recheck CBC

## 2020-02-10 ENCOUNTER — Other Ambulatory Visit: Payer: Self-pay | Admitting: Family Medicine

## 2020-02-10 DIAGNOSIS — N401 Enlarged prostate with lower urinary tract symptoms: Secondary | ICD-10-CM

## 2020-02-15 DIAGNOSIS — Z20822 Contact with and (suspected) exposure to covid-19: Secondary | ICD-10-CM | POA: Diagnosis not present

## 2020-02-29 ENCOUNTER — Encounter: Payer: Self-pay | Admitting: Family Medicine

## 2020-03-01 NOTE — Telephone Encounter (Signed)
Called pt and he notes that 2 weeks ago he had red colored urine for one day, then resolved He feels fine now  Will need to eval, scheduled him an appt for next week

## 2020-03-09 NOTE — Progress Notes (Addendum)
[facility] [address] [place], [place] [address] [phone]  Date:  03/10/2020   Name:  Earl Ruiz   DOB:  [birth year]   MRN:  [id number]  PCP:  Darreld Mclean, MD    Chief Complaint: Hematuria (couple weeks ago, noticied blood in urine) and Abdominal Pain (left side abdominal, no n/v)   History of Present Illness:  Earl Ruiz is a 59 y.o. 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  On 8/4 he noticed a sensation of urinary urgency/ felt like he needed to move his bowels.   He then saw bright red blood in his urine on 8/4- he thought this might be due to drinking red gatorade Cleared up the next day, did ok until blood in urine returned yesterday- bright red  Then returned just yesterday- he noted bright red blood in his urine  He notes some left sided abd discomfort but not severe No flank pain No blood in his stool No fever or vomiting No pain with urination  No history of kidney stones  Never a smoker  No genital or testicular pain or concerns   BP Readings from Last 3 Encounters:  03/10/20 122/72  01/04/20 (!) 146/98  06/01/19 124/80   Lab Results  Component Value Date   HGBA1C 5.7 01/04/2020    Patient Active Problem List   Diagnosis Date Noted  . Pre-diabetes 08/25/2015  . Choking episode occurring at night 05/10/2014  . Snoring 05/10/2014  . Sleep apnea 05/10/2014    Past Medical History:  Diagnosis Date  . Allergy   . Choking episode occurring at night 05/10/2014  . Sleep apnea 05/10/2014  . Snoring 05/10/2014    Past Surgical History:  Procedure Laterality Date  . no prior surgery      Social History   Tobacco Use  . Smoking status: Never  Smoker  . Smokeless tobacco: Never Used  Substance Use Topics  . Alcohol use: No    Comment: occ  . Drug use: No    Family History  Problem Relation Age of Onset  . Diabetes Father   . Colon cancer Neg Hx     No Known Allergies  Medication list has been reviewed and updated.  Current Outpatient Medications on File Prior to Visit  Medication Sig Dispense Refill  . albuterol (PROVENTIL HFA;VENTOLIN HFA) 108 (90 Base) MCG/ACT inhaler Inhale 2 puffs into the lungs every 6 (six) hours as needed for wheezing or shortness of breath. 1 Inhaler 6  . azelastine (ASTELIN) 0.1 % nasal spray USE 1 SPRAY(S) IN EACH NOSTRIL TWICE DAILY AS DIRECTED 30 mL 5  . cetirizine-pseudoephedrine (ZYRTEC-D) 5-120 MG tablet Take 1 tablet by mouth 2 (two) times daily.    . Insulin Pen Needle 32G X 4 MM MISC Use as directed with Saxenda daily. 100 each 1  . meloxicam (MOBIC) 15 MG tablet TAKE 1 TABLET BY MOUTH ONCE DAILY AS NEEDED FOR  KNEE  PAIN 30 tablet 1  . rosuvastatin (CRESTOR) 20 MG tablet Take 1 tablet (20 mg total) by mouth daily. 90 tablet 3  . tamsulosin (FLOMAX) 0.4 MG CAPS capsule TAKE 1 CAPSULE BY MOUTH EVERY DAY 90 capsule 1  . Liraglutide -Weight Management ([place])  18 MG/3ML SOPN Inject 3 mg into the skin daily. Start with 0.6 daily for one week, increase by 0.6 weekly until goal dose (Patient not taking: Reported on 03/10/2020) 5 pen 2   No current facility-administered medications on file prior to visit.    Review of Systems:  As per HPI- otherwise negative.   Physical Examination: Vitals:   03/10/20 1322  BP: 122/72  Pulse: 85  Resp: 17  SpO2: 98%   Vitals:   03/10/20 1322  Weight: 187 lb (84.8 kg)  Height: 5\' 7"  (1.702 m)   Body mass index is 29.29 kg/m. Ideal Body Weight: Weight in (lb) to have BMI = 25: 159.3  GEN: no acute distress.  Overweight, looks well  HEENT: Atraumatic, Normocephalic.  Ears and Nose: No external deformity. CV: RRR, No M/G/R. No JVD. No thrill.  No extra heart sounds. PULM: CTA B, no wheezes, crackles, rhonchi. No retractions. No resp. distress. No accessory muscle use. ABD: S, NT, ND, +BS. No rebound. No HSM.  abd is benign at this time  EXTR: No c/c/e PSYCH: Normally interactive. Conversant.   Results for orders placed or performed in visit on 03/10/20  POCT urinalysis dipstick  Result Value Ref Range   Color, UA [place] (A) yellow   Clarity, UA cloudy (A) clear   Glucose, UA negative negative mg/dL   Bilirubin, UA negative negative   Ketones, POC UA small (15) (A) negative mg/dL   Spec Grav, UA >=1.030 (A) 1.010 - 1.025   Blood, UA large (A) negative   pH, UA 5.0 5.0 - 8.0   Protein Ur, POC =100 (A) negative mg/dL   Urobilinogen, UA 0.2 0.2 or 1.0 E.U./dL   Nitrite, UA Negative Negative   Leukocytes, UA Trace (A) Negative    Assessment and Plan: Gross hematuria - Plan: POCT urinalysis dipstick, CT RENAL STONE STUDY, CBC, Comprehensive metabolic panel, Urine Culture, Urine Microscopic Only, CANCELED: Urine Microscopic Only  Pt here today with concern of gross hematuria and some vague left-sided pain Concern for kidney stone versus other pathology of the urinary system Urine today is grossly bloody/[place] We will obtain lab work, and stat CT renal stone protocol This visit occurred during the SARS-CoV-2 public health emergency.  Safety protocols were in place, including screening questions prior to the visit, additional usage of staff PPE, and extensive cleaning of exam room while observing appropriate contact time as indicated for disinfecting solutions.   Signed Lamar Blinks, MD  CT RENAL STONE STUDY  Result Date: 03/10/2020 CLINICAL DATA:  Left abdominal pain and hematuria. EXAM: CT ABDOMEN AND PELVIS WITHOUT CONTRAST TECHNIQUE: Multidetector CT imaging of the abdomen and pelvis was performed following the standard protocol without IV contrast. COMPARISON:  None. FINDINGS: Lower chest: On the top most image, there is  a suggestion of a 3 by 4 mm right lower lobe pulmonary nodule, and questionable volume averaging of a 4 by 5 mm right lower lobe subpleural nodule (image 1/3.). Hepatobiliary: Contracted gallbladder.  Otherwise unremarkable. Pancreas: Unremarkable Spleen: Unremarkable Adrenals/Urinary Tract: Both adrenal glands appear normal. There is mild left hydronephrosis believed to be caused by a 1.5 by 1.4 by 0.8 cm stone medially in the left collecting system probably obstructing the left UPJ. There is also a nonobstructive left kidney lower pole 0.5 cm calculus on image 49/4. No separate ureteral calculus identified. No bladder calculus noted. The prostate gland demonstrates central calcification including of the median lobe which indents the bladder base. Stomach/Bowel: Unremarkable Vascular/Lymphatic: Mild aortoiliac  atherosclerotic vascular disease. Reproductive: Prostate gland mildly indents the urinary bladder. Dystrophic central calcifications in the prostate gland. 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 Musculoskeletal: Mild bridging spurring of both sacroiliac joints. Mild degenerative spurring in both hips. Lower lumbar degenerative facet arthropathy and mild degenerative disc disease resulting in mild bilateral foraminal stenosis at L4-5 and equivocal bilateral foraminal stenosis at L3-4. IMPRESSION: 1. Mild left hydronephrosis believed to be caused by a 1.5 by 1.4 by 0.8 cm stone medially in the left collecting system probably obstructing the left UPJ. 2. There is also a nonobstructive 0.5 cm left kidney lower pole calculus. 3. 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 4. Lower lumbar degenerative facet arthropathy and mild degenerative disc disease resulting in mild bilateral foraminal stenosis at L4-5 and equivocal bilateral  foraminal stenosis at L3-4. 5. On the top most image, there is a suggestion of a 3 by 4 mm right lower lobe pulmonary nodule, and questionable volume averaging of a 4 by 5 mm right lower lobe subpleural nodule. No follow-up needed if patient is low-risk (and has no known or suspected primary neoplasm). Non-contrast chest CT can be considered in 12 months if patient is high-risk. This recommendation follows the consensus statement: Guidelines for Management of Incidental Pulmonary Nodules Detected on CT Images: From the Fleischner Society 2017; Radiology 2017; 284:228-243. 6. Aortic atherosclerosis. Aortic Atherosclerosis (ICD10-I70.0). Electronically Signed   By: Van Clines M.D.   On: 03/10/2020 16:27   Received CT report as above Called [facility]-thankfully, they can see pt tomorrow- arrive a few minutes early for 8:15 at [facility]   I called the patient's cell and his wife, no answer.  Left detailed message on machine.  I will try them again later today  We will follow up with patient regarding other findings on CT  Called patient and was able to reach him, went over findings of renal stone and appointment with [facility]. They state understanding Will send MyChart message to patient with the rest of the CT report and further details  Addendum 8/27, received labs as below-message to patient  Results for orders placed or performed in visit on 03/10/20  CBC  Result Value Ref Range   WBC 8.4 3.8 - 10.8 Thousand/uL   RBC 4.60 4.20 - 5.80 Million/uL   Hemoglobin 13.4 13.2 - 17.1 g/dL   HCT 40.2 38 - 50 %   MCV 87.4 80.0 - 100.0 fL   MCH 29.1 27.0 - 33.0 pg   MCHC 33.3 32.0 - 36.0 g/dL   RDW 11.8 11.0 - 15.0 %   Platelets 389 140 - 400 Thousand/uL   MPV 10.0 7.5 - 12.5 fL  Comprehensive metabolic panel  Result Value Ref Range   Glucose, Bld 93 65 - 99 mg/dL   BUN 20 7 - 25 mg/dL   Creat 0.78 0.70 - 1.33 mg/dL   BUN/Creatinine Ratio NOT APPLICABLE 6 - 22 (calc)    Sodium 138 135 - 146 mmol/L   Potassium 4.0 3.5 - 5.3 mmol/L   Chloride 104 98 - 110 mmol/L   CO2 22 20 - 32 mmol/L   Calcium 10.0 8.6 - 10.3 mg/dL   Total Protein 7.8 6.1 - 8.1 g/dL   Albumin 4.9 3.6 - 5.1 g/dL   Globulin 2.9 1.9 - 3.7 g/dL (calc)   AG Ratio 1.7 1.0 - 2.5 (calc)  Total Bilirubin 0.5 0.2 - 1.2 mg/dL   Alkaline phosphatase (APISO) 61 35 - 144 U/L   AST 21 10 - 35 U/L   ALT 26 9 - 46 U/L  Urine Microscopic Only  Result Value Ref Range   WBC, UA 10-20 (A) 0 - 5 /HPF   RBC / HPF > OR = 60 (A) 0 - 2 /HPF   Squamous Epithelial / LPF NONE SEEN < OR = 5 /HPF   Bacteria, UA NONE SEEN NONE SEEN /HPF   Hyaline Cast NONE SEEN NONE SEEN /LPF  POCT urinalysis dipstick  Result Value Ref Range   Color, UA [place] (A) yellow   Clarity, UA cloudy (A) clear   Glucose, UA negative negative mg/dL   Bilirubin, UA negative negative   Ketones, POC UA small (15) (A) negative mg/dL   Spec Grav, UA >=1.030 (A) 1.010 - 1.025   Blood, UA large (A) negative   pH, UA 5.0 5.0 - 8.0   Protein Ur, POC =100 (A) negative mg/dL   Urobilinogen, UA 0.2 0.2 or 1.0 E.U./dL   Nitrite, UA Negative Negative   Leukocytes, UA Trace (A) Negative

## 2020-03-10 ENCOUNTER — Encounter: Payer: Self-pay | Admitting: Family Medicine

## 2020-03-10 ENCOUNTER — Ambulatory Visit (HOSPITAL_BASED_OUTPATIENT_CLINIC_OR_DEPARTMENT_OTHER)
Admission: RE | Admit: 2020-03-10 | Discharge: 2020-03-10 | Disposition: A | Discharge status: Home / Self Care | Payer: BC Managed Care – PPO | Source: Ambulatory Visit | Attending: Family Medicine | Admitting: Family Medicine

## 2020-03-10 ENCOUNTER — Ambulatory Visit: Payer: BC Managed Care – PPO | Admitting: Family Medicine

## 2020-03-10 ENCOUNTER — Other Ambulatory Visit: Payer: Self-pay

## 2020-03-10 VITALS — BP 122/72 | HR 85 | Resp 17 | Ht 67.0 in | Wt 187.0 lb

## 2020-03-10 DIAGNOSIS — M160 Bilateral primary osteoarthritis of hip: Secondary | ICD-10-CM | POA: Diagnosis not present

## 2020-03-10 DIAGNOSIS — R310 Gross hematuria: Secondary | ICD-10-CM | POA: Diagnosis not present

## 2020-03-10 DIAGNOSIS — K820 Obstruction of gallbladder: Secondary | ICD-10-CM | POA: Diagnosis not present

## 2020-03-10 DIAGNOSIS — N132 Hydronephrosis with renal and ureteral calculous obstruction: Secondary | ICD-10-CM | POA: Diagnosis not present

## 2020-03-10 DIAGNOSIS — I700 Atherosclerosis of aorta: Secondary | ICD-10-CM | POA: Diagnosis not present

## 2020-03-10 LAB — COMPREHENSIVE METABOLIC PANEL
AG Ratio: 1.7 (calc) (ref 1.0–2.5)
ALT: 26 U/L (ref 9–46)
AST: 21 U/L (ref 10–35)
Albumin: 4.9 g/dL (ref 3.6–5.1)
Alkaline phosphatase (APISO): 61 U/L (ref 35–144)
BUN: 20 mg/dL (ref 7–25)
CO2: 22 mmol/L (ref 20–32)
Calcium: 10 mg/dL (ref 8.6–10.3)
Chloride: 104 mmol/L (ref 98–110)
Creat: 0.78 mg/dL (ref 0.70–1.33)
Globulin: 2.9 g/dL (calc) (ref 1.9–3.7)
Glucose, Bld: 93 mg/dL (ref 65–99)
Potassium: 4 mmol/L (ref 3.5–5.3)
Sodium: 138 mmol/L (ref 135–146)
Total Bilirubin: 0.5 mg/dL (ref 0.2–1.2)
Total Protein: 7.8 g/dL (ref 6.1–8.1)

## 2020-03-10 LAB — POCT URINALYSIS DIP (MANUAL ENTRY)
Bilirubin, UA: NEGATIVE
Glucose, UA: NEGATIVE mg/dL
Nitrite, UA: NEGATIVE
Protein Ur, POC: 100 mg/dL — AB
Spec Grav, UA: >=1.03 — AB (ref 1.010–1.025)
Urobilinogen, UA: 0.2 E.U./dL
pH, UA: 5 (ref 5.0–8.0)

## 2020-03-10 LAB — CBC
HCT: 40.2 % (ref 38.5–50.0)
Hemoglobin: 13.4 g/dL (ref 13.2–17.1)
MCH: 29.1 pg (ref 27.0–33.0)
MCHC: 33.3 g/dL (ref 32.0–36.0)
MCV: 87.4 fL (ref 80.0–100.0)
MPV: 10 fL (ref 7.5–12.5)
Platelets: 389 10*3/uL (ref 140–400)
RBC: 4.6 10*6/uL (ref 4.20–5.80)
RDW: 11.8 % (ref 11.0–15.0)
WBC: 8.4 10*3/uL (ref 3.8–10.8)

## 2020-03-10 NOTE — Patient Instructions (Signed)
It was good to see you again- I will be in touch with your labs, urine, and CT scan reports asap  Please let me know if you are not doing ok, having any new symptoms or feeling worse

## 2020-03-11 ENCOUNTER — Encounter: Payer: Self-pay | Admitting: Family Medicine

## 2020-03-11 DIAGNOSIS — R310 Gross hematuria: Secondary | ICD-10-CM | POA: Diagnosis not present

## 2020-03-11 DIAGNOSIS — I700 Atherosclerosis of aorta: Secondary | ICD-10-CM | POA: Diagnosis not present

## 2020-03-11 DIAGNOSIS — N200 Calculus of kidney: Secondary | ICD-10-CM | POA: Diagnosis not present

## 2020-03-11 LAB — URINE CULTURE
MICRO NUMBER:: 10876289
Result:: NO GROWTH
SPECIMEN QUALITY:: ADEQUATE

## 2020-03-11 LAB — URINALYSIS, MICROSCOPIC ONLY
Bacteria, UA: NONE SEEN /HPF
Hyaline Cast: NONE SEEN /LPF
RBC / HPF: >=60 /HPF — AB (ref 0–2)
Squamous Epithelial / HPF: NONE SEEN /HPF (ref <=5)

## 2020-03-15 ENCOUNTER — Other Ambulatory Visit: Payer: Self-pay | Admitting: Urology

## 2020-03-24 ENCOUNTER — Other Ambulatory Visit (HOSPITAL_COMMUNITY): Payer: BC Managed Care – PPO

## 2020-03-24 NOTE — Progress Notes (Signed)
Patient to arrive at [facility] on 03/28/2020. History and medications reviewed. All pre-procedure instructions given. Will stop mobic 48 hours prior. NPO after MN. Driver secured.

## 2020-03-25 ENCOUNTER — Other Ambulatory Visit (HOSPITAL_COMMUNITY)
Admission: RE | Admit: 2020-03-25 | Discharge: 2020-03-25 | Disposition: A | Discharge status: Home / Self Care | Payer: BC Managed Care – PPO | Source: Ambulatory Visit | Attending: Urology | Admitting: Urology

## 2020-03-25 DIAGNOSIS — Z20822 Contact with and (suspected) exposure to covid-19: Secondary | ICD-10-CM | POA: Diagnosis not present

## 2020-03-25 DIAGNOSIS — Z01812 Encounter for preprocedural laboratory examination: Secondary | ICD-10-CM | POA: Diagnosis not present

## 2020-03-25 LAB — SARS CORONAVIRUS 2 (TAT 6-24 HRS): SARS Coronavirus 2: NEGATIVE

## 2020-03-25 NOTE — H&P (Signed)
Office Visit Report     03/11/2020   --------------------------------------------------------------------------------   Earl Ruiz  MRN: [id number]  DOB: 1961-05-15, 59 year old Male  SSN:    PRIMARY CARE:    REFERRING:  Lamar Blinks  PROVIDER:  Irine Seal, M.D.  LOCATION:  [facility] [phone]     --------------------------------------------------------------------------------   CC: I have kidney stones.  HPI: Earl Ruiz is a 59 year-old male patient who was referred by [name] who is here for renal calculi.  The problem is on the left side. He first stated noticing pain on 02/17/2020.   Earl Ruiz is a 59 yo AAM who had the onset on 8/4 of mild gross hematuria and then he had some intermittent left flank pain that actually began in July. His pain would improve with a BM. He was seen by Dr. Edilia Bo and a CT was ordered and he was found to have a 1.5cm LUPJ stone and a 40mm LLP stone. He is ok today. The stone is 1300-1500HU. He continues to have some hematuria. He has had no prior stones. He has had no UTI's. He has minimal voiding symptoms and has been on tamsulosin for BPH for a couple of years. He had a UA yesterday with TNTC RBC's and a few WBC. A Culture is pending. He has had no fever. He has no other GU history.      ALLERGIES: None   MEDICATIONS: Tamsulosin Hcl 0.4 mg capsule  Zyrtec  Albuterol Sulfate  Azelastine Hcl  Meloxicam  Rosuvastatin Calcium     GU PSH: None   NON-GU PSH: None   GU PMH: Renal calculus    NON-GU PMH: Hypercholesterolemia    FAMILY HISTORY: Kidney Failure - Sister   SOCIAL HISTORY: Marital Status: Married Ethnicity: Not Hispanic Or Latino; Race: Black or African American Current Smoking Status: Patient has never smoked.   Tobacco Use Assessment Completed: Used Tobacco in last 30 days? Drinks 1 drink per day.  Drinks 2 caffeinated drinks per day.    REVIEW OF SYSTEMS:    GU Review Male:   Patient  denies frequent urination, hard to postpone urination, burning/ pain with urination, get up at night to urinate, leakage of urine, stream starts and stops, trouble starting your stream, have to strain to urinate , erection problems, and penile pain.  Gastrointestinal (Upper):   Patient denies nausea, vomiting, and indigestion/ heartburn.  Gastrointestinal (Lower):   Patient denies diarrhea and constipation.  Constitutional:   Patient denies fever, night sweats, weight loss, and fatigue.  Skin:   Patient denies skin rash/ lesion and itching.  Eyes:   Patient denies blurred vision and double vision.  Ears/ Nose/ Throat:   Patient reports sinus problems. Patient denies sore throat.  Hematologic/Lymphatic:   Patient denies swollen glands and easy bruising.  Cardiovascular:   Patient denies leg swelling and chest pains.  Respiratory:   Patient denies shortness of breath and cough.  Endocrine:   Patient denies excessive thirst.  Musculoskeletal:   Patient denies back pain and joint pain.  Neurological:   Patient denies headaches and dizziness.  Psychologic:   Patient denies depression and anxiety.   Notes: blood in urine    VITAL SIGNS:      03/11/2020 08:36 AM  Weight 187 lb / 84.82 kg  Height 68 in / 172.72 cm  BP 141/90 mmHg  Pulse 74 /min  Temperature 97.7 F / 36.5 C  BMI 28.4 kg/m   MULTI-SYSTEM PHYSICAL EXAMINATION:  Constitutional: Well-nourished. No physical deformities. Normally developed. Good grooming.  Neck: Neck symmetrical, not swollen. Normal tracheal position.  Respiratory: Normal breath sounds. No labored breathing, no use of accessory muscles.   Cardiovascular: Regular rate and rhythm. No murmur, no gallop.   Skin: No paleness, no jaundice, no cyanosis. No lesion, no ulcer, no rash.  Neurologic / Psychiatric: Oriented to time, oriented to place, oriented to person. No depression, no anxiety, no agitation.  Gastrointestinal: Obese abdomen. No mass, no tenderness, no  rigidity.   Musculoskeletal: Normal gait and station of head and neck.     Complexity of Data:  Lab Test Review:   BMP, CBC with Diff  Urine Test Review:   Urinalysis  X-Ray Review: [facility] Protocol: Reviewed Films. Reviewed Report. Discussed With Patient.    Notes:                     CT also shows prostatic stones and ASPVD.    PROCEDURES:          Urinalysis w/Scope Dipstick Dipstick Cont'd Micro  Color: Red Bilirubin: Invalid mg/dL WBC/hpf:   Appearance: Turbid Ketones: Invalid mg/dL RBC/hpf: Packed/hpf  Specific Gravity: Invalid Blood: Invalid ery/uL Bacteria:   pH: Invalid Protein: Invalid mg/dL Cystals: NS (Not Seen)  Glucose: Invalid mg/dL Urobilinogen: Invalid mg/dL Casts: NS (Not Seen)    Nitrites: Invalid Trichomonas: Not Present    Leukocyte Esterase: Invalid leu/uL Mucous: Not Present      Epithelial Cells: NS (Not Seen)      Yeast: NS (Not Seen)      Sperm: Not Present    Notes: unable to quantitate other elements due to increased rbcs unable to quantitate due to increased rbcs    ASSESSMENT:      ICD-10 Details  1 GU:   Renal calculus - N20.0 Left, Acute, Systemic Symptoms - He has a 1.5cm LUPJ stone with a 47mm LLP stone. There is mild hydro. The density is 1300-1500HU. I discussed ESWL, URS and PCNL with the risks and benefits of each. We will proceed with ESWL. I reviewed the risks of ESWL including bleeding, infection, injury to the kidney or adjacent structures, failure to fragment the stone, need for ancillary procedures, thrombotic events, cardiac arrhythmias and sedation complications. He is at slightly higher risk of need for retreatment with the stone density as measured.   2   Gross hematuria - M08.6 Acute, Uncomplicated  3 NON-GU:   Atherosclerosis of aorta - I70.0 Chronic, Stable - He has aortoiliac atherosclerosis but is on a statin.    PLAN:           Schedule Return Visit/Planned Activity: Next Available Appointment - Schedule Surgery   Procedure: Unspecified Date - ESWL - 50590, left Notes: next available.           Document Letter(s):  Created for Patient: Clinical Summary         Notes:   CC: Dr. [name].     * Signed by Irine Seal, M.D. on 03/11/20 at 9:27 AM (EDT)*     The information contained in this medical record document is considered private and confidential patient information. This information can only be used for the medical diagnosis and/or medical services that are being provided by the patient's selected caregivers. This information can only be distributed outside of the patient's care if the patient agrees and signs waivers of authorization for this information to be sent to an outside source or route.

## 2020-03-28 ENCOUNTER — Other Ambulatory Visit: Payer: Self-pay

## 2020-03-28 ENCOUNTER — Encounter (HOSPITAL_BASED_OUTPATIENT_CLINIC_OR_DEPARTMENT_OTHER): Payer: Self-pay | Admitting: Urology

## 2020-03-28 ENCOUNTER — Ambulatory Visit (HOSPITAL_COMMUNITY): Payer: BC Managed Care – PPO

## 2020-03-28 ENCOUNTER — Ambulatory Visit (HOSPITAL_BASED_OUTPATIENT_CLINIC_OR_DEPARTMENT_OTHER)
Admission: RE | Admit: 2020-03-28 | Discharge: 2020-03-28 | Disposition: A | Discharge status: Home / Self Care | Payer: BC Managed Care – PPO | Attending: Urology | Admitting: Urology

## 2020-03-28 ENCOUNTER — Encounter (HOSPITAL_BASED_OUTPATIENT_CLINIC_OR_DEPARTMENT_OTHER)
Admission: RE | Disposition: A | Discharge status: Home / Self Care | Payer: Self-pay | Source: Home / Self Care | Attending: Urology

## 2020-03-28 DIAGNOSIS — I700 Atherosclerosis of aorta: Secondary | ICD-10-CM | POA: Insufficient documentation

## 2020-03-28 DIAGNOSIS — E7800 Pure hypercholesterolemia, unspecified: Secondary | ICD-10-CM | POA: Insufficient documentation

## 2020-03-28 DIAGNOSIS — Z791 Long term (current) use of non-steroidal anti-inflammatories (NSAID): Secondary | ICD-10-CM | POA: Insufficient documentation

## 2020-03-28 DIAGNOSIS — N420 Calculus of prostate: Secondary | ICD-10-CM | POA: Diagnosis not present

## 2020-03-28 DIAGNOSIS — G8929 Other chronic pain: Secondary | ICD-10-CM

## 2020-03-28 DIAGNOSIS — N200 Calculus of kidney: Secondary | ICD-10-CM | POA: Insufficient documentation

## 2020-03-28 DIAGNOSIS — Z79899 Other long term (current) drug therapy: Secondary | ICD-10-CM | POA: Insufficient documentation

## 2020-03-28 HISTORY — PX: EXTRACORPOREAL SHOCK WAVE LITHOTRIPSY: SHX1557

## 2020-03-28 SURGERY — LITHOTRIPSY, ESWL
Anesthesia: LOCAL | Laterality: Left

## 2020-03-28 MED ORDER — CIPROFLOXACIN HCL 500 MG PO TABS
500.0000 mg | ORAL_TABLET | ORAL | Status: AC
  Administered 2020-03-28: 500 mg via ORAL

## 2020-03-28 MED ORDER — OXYCODONE-ACETAMINOPHEN 5-325 MG PO TABS
1.0000 | ORAL_TABLET | ORAL | 0 refills | Status: AC | PRN
Start: 2020-03-28 — End: 2021-03-28

## 2020-03-28 MED ORDER — DIPHENHYDRAMINE HCL 25 MG PO CAPS
25.0000 mg | ORAL_CAPSULE | ORAL | Status: AC
  Administered 2020-03-28: 25 mg via ORAL

## 2020-03-28 MED ORDER — SODIUM CHLORIDE 0.9 % IV SOLN: INTRAVENOUS | Status: DC

## 2020-03-28 MED ORDER — DIAZEPAM 5 MG PO TABS
10.0000 mg | ORAL_TABLET | ORAL | Status: AC
  Administered 2020-03-28: 10 mg via ORAL

## 2020-03-28 NOTE — Discharge Instructions (Signed)
Lithotripsy, Care After °This sheet gives you information about how to care for yourself after your procedure. Your health care provider may also give you more specific instructions. If you have problems or questions, contact your health care provider. °What can I expect after the procedure? °After the procedure, it is common to have: °· Some blood in your urine. This should only last for a few days. °· Soreness in your back, sides, or upper abdomen for a few days. °· Blotches or bruises on your back where the pressure wave entered the skin. °· Pain, discomfort, or nausea when pieces (fragments) of the kidney stone move through the tube that carries urine from the kidney to the bladder (ureter). Stone fragments may pass soon after the procedure, but they may continue to pass for up to 4-8 weeks. °? If you have severe pain or nausea, contact your health care provider. This may be caused by a large stone that was not broken up, and this may mean that you need more treatment. °· Some pain or discomfort during urination. °· Some pain or discomfort in the lower abdomen or (in men) at the base of the penis. °Follow these instructions at home: °Medicines °· Take over-the-counter and prescription medicines only as told by your health care provider. °· If you were prescribed an antibiotic medicine, take it as told by your health care provider. Do not stop taking the antibiotic even if you start to feel better. °· Do not drive for 24 hours if you were given a medicine to help you relax (sedative). °· Do not drive or use heavy machinery while taking prescription pain medicine. °Eating and drinking ° °  ° °· Drink enough water and fluids to keep your urine clear or pale yellow. This helps any remaining pieces of the stone to pass. It can also help prevent new stones from forming. °· Eat plenty of fresh fruits and vegetables. °· Follow instructions from your health care provider about eating and drinking restrictions. You may be  instructed: °? To reduce how much salt (sodium) you eat or drink. Check ingredients and nutrition facts on packaged foods and beverages. °? To reduce how much meat you eat. °· Eat the recommended amount of calcium for your age and gender. Ask your health care provider how much calcium you should have. °General instructions °· Get plenty of rest. °· Most people can resume normal activities 1-2 days after the procedure. Ask your health care provider what activities are safe for you. °· Your health care provider may direct you to lie in a certain position (postural drainage) and tap firmly (percuss) over your kidney area to help stone fragments pass. Follow instructions as told by your health care provider. °· If directed, strain all urine through the strainer that was provided by your health care provider. °? Keep all fragments for your health care provider to see. Any stones that are found may be sent to a medical lab for examination. The stone may be as small as a grain of salt. °· Keep all follow-up visits as told by your health care provider. This is important. °Contact a health care provider if: °· You have pain that is severe or does not get better with medicine. °· You have nausea that is severe or does not go away. °· You have blood in your urine longer than your health care provider told you to expect. °· You have more blood in your urine. °· You have pain during urination that does   not go away.  You urinate more frequently than usual and this does not go away.  You develop a rash or any other possible signs of an allergic reaction. Get help right away if:  You have severe pain in your back, sides, or upper abdomen.  You have severe pain while urinating.  Your urine is very dark red.  You have blood in your stool (feces).  You cannot pass any urine at all.  You feel a strong urge to urinate after emptying your bladder.  You have a fever or chills.  You develop shortness of breath,  difficulty breathing, or chest pain.  You have severe nausea that leads to persistent vomiting.  You faint. Summary  After this procedure, it is common to have some pain, discomfort, or nausea when pieces (fragments) of the kidney stone move through the tube that carries urine from the kidney to the bladder (ureter). If this pain or nausea is severe, however, you should contact your health care provider.  Most people can resume normal activities 1-2 days after the procedure. Ask your health care provider what activities are safe for you.  Drink enough water and fluids to keep your urine clear or pale yellow. This helps any remaining pieces of the stone to pass, and it can help prevent new stones from forming.  If directed, strain your urine and keep all fragments for your health care provider to see. Fragments or stones may be as small as a grain of salt.  Get help right away if you have severe pain in your back, sides, or upper abdomen or have severe pain while urinating. This information is not intended to replace advice given to you by your health care provider. Make sure you discuss any questions you have with your health care provider. Document Revised: 10/13/2018 Document Reviewed: 05/23/2016 Elsevier Patient Education  [place] Instructions  Activity: Get plenty of rest for the remainder of the day. A responsible individual must stay with you for 24 hours following the procedure.  For the next 24 hours, DO NOT: -Drive a car -Paediatric nurse -Drink alcoholic beverages -Take any medication unless instructed by your physician -Make any legal decisions or sign important papers.  Meals: Start with liquid foods such as gelatin or soup. Progress to regular foods as tolerated. Avoid greasy, spicy, heavy foods. If nausea and/or vomiting occur, drink only clear liquids until the nausea and/or vomiting subsides. Call your physician if vomiting  continues.

## 2020-03-28 NOTE — Interval H&P Note (Signed)
History and Physical Interval Note:  03/28/2020 9:26 AM  Earl Ruiz  has presented today for surgery, with the diagnosis of LEFT RENAL STONE.  The various methods of treatment have been discussed with the patient and family. After consideration of risks, benefits and other options for treatment, the patient has consented to  Procedure(s): EXTRACORPOREAL SHOCK WAVE LITHOTRIPSY (ESWL) (Left) as a surgical intervention.  The patient's history has been reviewed, patient examined, no change in status, stable for surgery.  I have reviewed the patient's chart and labs.  Questions were answered to the patient's satisfaction.  Stable 15 mm stone left UPJ. Pt without fever or dysuria.    Festus Aloe

## 2020-03-28 NOTE — Op Note (Signed)
Left 15 mm renal pelvic stone   Left ESWL   Findings; It took some time to settle patient as he moved around first 1500 shocks, but eventually stone fragmented quite well and mostly disappeared. He may need a staged procedure if he fails to pass the fragments.

## 2020-03-29 ENCOUNTER — Encounter (HOSPITAL_BASED_OUTPATIENT_CLINIC_OR_DEPARTMENT_OTHER): Payer: Self-pay | Admitting: Urology

## 2020-04-06 ENCOUNTER — Other Ambulatory Visit: Payer: Self-pay | Admitting: Family Medicine

## 2020-04-06 DIAGNOSIS — M25561 Pain in right knee: Secondary | ICD-10-CM

## 2020-04-06 DIAGNOSIS — J3089 Other allergic rhinitis: Secondary | ICD-10-CM

## 2020-04-12 DIAGNOSIS — N200 Calculus of kidney: Secondary | ICD-10-CM | POA: Diagnosis not present

## 2020-05-19 ENCOUNTER — Ambulatory Visit: Payer: BC Managed Care – PPO | Attending: Internal Medicine

## 2020-05-19 DIAGNOSIS — Z23 Encounter for immunization: Secondary | ICD-10-CM

## 2020-05-19 NOTE — Progress Notes (Signed)
   Covid-19 Vaccination Clinic  Name:  Earl Ruiz    MRN: [id number] DOB: 09/11/1960  05/19/2020  Earl Ruiz was observed post Covid-19 immunization for 15 minutes without incident. He was provided with Vaccine Information Sheet and instruction to access the V-Safe system.   Earl Ruiz was instructed to call 911 with any severe reactions post vaccine: Marland Kitchen Difficulty breathing  . Swelling of face and throat  . A fast heartbeat  . A bad rash all over body  . Dizziness and weakness

## 2020-06-01 ENCOUNTER — Other Ambulatory Visit: Payer: Self-pay | Admitting: Family Medicine

## 2020-06-01 DIAGNOSIS — J3089 Other allergic rhinitis: Secondary | ICD-10-CM

## 2020-06-06 DIAGNOSIS — N200 Calculus of kidney: Secondary | ICD-10-CM | POA: Diagnosis not present

## 2020-07-26 ENCOUNTER — Other Ambulatory Visit: Payer: Self-pay | Admitting: Family Medicine

## 2020-07-26 DIAGNOSIS — G8929 Other chronic pain: Secondary | ICD-10-CM

## 2020-07-26 DIAGNOSIS — J3089 Other allergic rhinitis: Secondary | ICD-10-CM

## 2020-07-28 DIAGNOSIS — Z1152 Encounter for screening for COVID-19: Secondary | ICD-10-CM | POA: Diagnosis not present

## 2020-08-28 DIAGNOSIS — J329 Chronic sinusitis, unspecified: Secondary | ICD-10-CM | POA: Diagnosis not present

## 2020-08-28 DIAGNOSIS — R0982 Postnasal drip: Secondary | ICD-10-CM | POA: Diagnosis not present

## 2020-08-30 ENCOUNTER — Encounter: Payer: Self-pay | Admitting: Family Medicine

## 2020-09-05 NOTE — Progress Notes (Addendum)
[facility] at [facility] [address], [place], [place] [address] [phone]  Date:  09/08/2020   Name:  Earl Ruiz   DOB:  Jul 30, 1960   MRN:  [id number]  PCP:  Darreld Mclean, MD    Chief Complaint: Nasal Congestion   History of Present Illness:  Earl Ruiz is a 60 y.o. very pleasant male patient who presents with the following:  Pt recently contacted me with concern of illness symptoms via MyChart, made appointment for today Last seen by myself in August - history of prediabetes, sleep apnea He went to UC on 2/13 and was rx augmentin for sinusitis- 2 weeks of nasal congestion and sinus pain/ discharge, cough  He continued to be concerned and wished to come in and be seen  Today he notes that he may have some nasal congestion that comes and goes  He does feel like he improved some with the abx- he finished this earlier with week He notes he was treated with 1 week of Augmentin Mostly nasal s at this time x, he occasionally has a cough No ST No vomiting or diarrhea except for mild with abx, did not persist  No fever  He is not generally using his albuterol He is using vicks vaporub topically which does help some   Never a smoker   Lab Results  Component Value Date   HGBA1C 5.7 01/04/2020    Flu vaccine- will give today  covid series done including booster   Patient Active Problem List   Diagnosis Date Noted  . Pre-diabetes 08/25/2015  . Choking episode occurring at night 05/10/2014  . Snoring 05/10/2014  . Sleep apnea 05/10/2014    Past Medical History:  Diagnosis Date  . Allergy   . Choking episode occurring at night 05/10/2014  . Sleep apnea 05/10/2014  . Snoring 05/10/2014    Past Surgical History:  Procedure Laterality Date  . EXTRACORPOREAL SHOCK WAVE LITHOTRIPSY Left 03/28/2020   Procedure: EXTRACORPOREAL SHOCK WAVE LITHOTRIPSY (ESWL);  Surgeon: Festus Aloe, MD;  Location: [facility];  Service: Urology;  Laterality: Left;  . NO PAST SURGERIES    . no prior surgery      Social History   Tobacco Use  . Smoking status: Never Smoker  . Smokeless tobacco: Never Used  Substance Use Topics  . Alcohol use: No    Comment: occ  . Drug use: No    Family History  Problem Relation Age of Onset  . Diabetes Father   . Colon cancer Neg Hx     No Known Allergies  Medication list has been reviewed and updated.  Current Outpatient Medications on File Prior to Visit  Medication Sig Dispense Refill  . albuterol (VENTOLIN HFA) 108 (90 Base) MCG/ACT inhaler Inhale 2 puffs into the lungs every 6 (six) hours as needed for wheezing or shortness of breath. 18 g 5  . azelastine (ASTELIN) 0.1 % nasal spray USE 1 SPRAY(S) IN EACH NOSTRIL TWICE DAILY AS DIRECTED 30 mL 5  . cetirizine-pseudoephedrine (ZYRTEC-D) 5-120 MG tablet Take 1 tablet by mouth 2 (two) times daily.    . Insulin Pen Needle 32G X 4 MM MISC Use as directed with Saxenda daily. 100 each 1  . Liraglutide -Weight Management (SAXENDA) 18 MG/3ML SOPN Inject 3 mg into the skin daily. Start with 0.6 daily for one week, increase by 0.6 weekly until goal dose 5 pen 2  . meloxicam (MOBIC)  15 MG tablet Take 1 tablet (15 mg total) by mouth daily as needed for pain. 30 tablet 1  . oxyCODONE-acetaminophen (PERCOCET) 5-325 MG tablet Take 1-2 tablets by mouth every 4 (four) hours as needed for severe pain. 12 tablet 0  . rosuvastatin (CRESTOR) 20 MG tablet Take 1 tablet (20 mg total) by mouth daily. 90 tablet 3  . tamsulosin (FLOMAX) 0.4 MG CAPS capsule TAKE 1 CAPSULE BY MOUTH EVERY DAY 90 capsule 1   No current facility-administered medications on file prior to visit.    Review of Systems:  As per HPI- otherwise negative.   Physical Examination: Vitals:   09/08/20 0845  BP: 118/80  Pulse: 88  Resp: 17  Temp: (!) 97.2 F (36.2 C)  SpO2: 99%   Vitals:   09/08/20 0845  Weight: 194 lb (88 kg)  Height: 5'  7" (1.702 m)   Body mass index is 30.38 kg/m. Ideal Body Weight: Weight in (lb) to have BMI = 25: 159.3  GEN: no acute distress.  Overweight, otherwise looks well.  No cough or distress is noted HEENT: Atraumatic, Normocephalic.   Bilateral TM wnl, oropharynx normal.  PEERL,EOMI. mild swelling of nasal turbinates is noted without erythema Ears and Nose: No external deformity. CV: RRR, No M/G/R. No JVD. No thrill. No extra heart sounds. PULM: CTA B, no wheezes, crackles, rhonchi. No retractions. No resp. distress. No accessory muscle use. ABD: S, NT, ND. No rebound. No HSM.  Belly is benign EXTR: No c/c/e PSYCH: Normally interactive. Conversant.    Assessment and Plan: Sinus congestion - Plan: cetirizine-pseudoephedrine (ZYRTEC-D) 5-120 MG tablet, predniSONE (DELTASONE) 20 MG tablet, doxycycline (VIBRAMYCIN) 100 MG capsule, CBC  Pre-diabetes - Plan: Basic metabolic panel, Hemoglobin A1c  Patient following up today for persistent sinus congestion.  He was seen and treated appropriately with Augmentin for sinus infection about 10 days ago.  He continues to have some congestion, mostly nasal and occasional cough. Refill of Zyrtec-D Prescribed prednisone for 6 days Gave doxycycline prescription to hold, asked patient to start this if he is not feeling better in the next few days.  In this case he also is asked to alert me  Flu shot given  Basic labs pending- Will plan further follow- up pending labs. Request 40-month follow-up assuming all is well This visit occurred during the SARS-CoV-2 public health emergency.  Safety protocols were in place, including screening questions prior to the visit, additional usage of staff PPE, and extensive cleaning of exam room while observing appropriate contact time as indicated for disinfecting solutions.    Signed Lamar Blinks, MD  Received labs as below, message to pt  Results for orders placed or performed in visit on 93/71/69  Basic  metabolic panel  Result Value Ref Range   Sodium 138 135 - 145 mEq/L   Potassium 4.3 3.5 - 5.1 mEq/L   Chloride 103 96 - 112 mEq/L   CO2 26 19 - 32 mEq/L   Glucose, Bld 100 (H) 70 - 99 mg/dL   BUN 16 6 - 23 mg/dL   Creatinine, Ser 1.10 0.40 - 1.50 mg/dL   GFR 73.71 >60.00 mL/min   Calcium 9.4 8.4 - 10.5 mg/dL  CBC  Result Value Ref Range   WBC 8.1 4.0 - 10.5 K/uL   RBC 4.65 4.22 - 5.81 Mil/uL   Platelets 398.0 150.0 - 400.0 K/uL   Hemoglobin 13.7 13.0 - 17.0 g/dL   HCT 41.5 39.0 - 52.0 %   MCV 89.4 78.0 -  100.0 fl   MCHC 32.9 30.0 - 36.0 g/dL   RDW 13.2 11.5 - 15.5 %  Hemoglobin A1c  Result Value Ref Range   Hgb A1c MFr Bld 6.1 4.6 - 6.5 %

## 2020-09-08 ENCOUNTER — Ambulatory Visit: Payer: BC Managed Care – PPO | Admitting: Family Medicine

## 2020-09-08 ENCOUNTER — Encounter: Payer: Self-pay | Admitting: Family Medicine

## 2020-09-08 ENCOUNTER — Other Ambulatory Visit: Payer: Self-pay

## 2020-09-08 ENCOUNTER — Other Ambulatory Visit: Payer: Self-pay | Admitting: Family Medicine

## 2020-09-08 VITALS — BP 118/80 | HR 88 | Temp 97.2°F | Resp 17 | Ht 67.0 in | Wt 194.0 lb

## 2020-09-08 DIAGNOSIS — R7303 Prediabetes: Secondary | ICD-10-CM

## 2020-09-08 DIAGNOSIS — Z23 Encounter for immunization: Secondary | ICD-10-CM | POA: Diagnosis not present

## 2020-09-08 DIAGNOSIS — R0981 Nasal congestion: Secondary | ICD-10-CM

## 2020-09-08 DIAGNOSIS — J3089 Other allergic rhinitis: Secondary | ICD-10-CM

## 2020-09-08 LAB — CBC
HCT: 41.5 % (ref 39.0–52.0)
Hemoglobin: 13.7 g/dL (ref 13.0–17.0)
MCHC: 32.9 g/dL (ref 30.0–36.0)
MCV: 89.4 fl (ref 78.0–100.0)
Platelets: 398 10*3/uL (ref 150.0–400.0)
RBC: 4.65 Mil/uL (ref 4.22–5.81)
RDW: 13.2 % (ref 11.5–15.5)
WBC: 8.1 10*3/uL (ref 4.0–10.5)

## 2020-09-08 LAB — BASIC METABOLIC PANEL
BUN: 16 mg/dL (ref 6–23)
CO2: 26 mEq/L (ref 19–32)
Calcium: 9.4 mg/dL (ref 8.4–10.5)
Chloride: 103 mEq/L (ref 96–112)
Creatinine, Ser: 1.1 mg/dL (ref 0.40–1.50)
GFR: 73.71 mL/min (ref >60.00)
Glucose, Bld: 100 mg/dL — ABNORMAL HIGH (ref 70–99)
Potassium: 4.3 mEq/L (ref 3.5–5.1)
Sodium: 138 mEq/L (ref 135–145)

## 2020-09-08 LAB — HEMOGLOBIN A1C: Hgb A1c MFr Bld: 6.1 % (ref 4.6–6.5)

## 2020-09-08 MED ORDER — DOXYCYCLINE HYCLATE 100 MG PO CAPS: 100.0000 mg | ORAL_CAPSULE | Freq: Two times a day (BID) | ORAL | 0 refills | Status: DC

## 2020-09-08 MED ORDER — PREDNISONE 20 MG PO TABS: ORAL_TABLET | ORAL | 0 refills | Status: DC

## 2020-09-08 MED ORDER — CETIRIZINE-PSEUDOEPHEDRINE ER 5-120 MG PO TB12: 1.0000 | ORAL_TABLET | Freq: Two times a day (BID) | ORAL | 5 refills | Status: DC

## 2020-09-08 NOTE — Patient Instructions (Signed)
It was good to see you again today!  We are going to use a course of prednisone to try and clear up your sinus congestion.  Take this for 6 days as directed If you are not improving or if symptoms return then you can fill and use the doxycycline antibiotic rx - please alert me in this case!  We will check basic labs today- I will be in touch with your reports Flu shot today Assuming all is well please see me in 6 months

## 2020-09-08 NOTE — Addendum Note (Signed)
Addended by: Wynonia Musty A on: 09/08/2020 09:31 AM   Modules accepted: Orders

## 2020-11-22 ENCOUNTER — Other Ambulatory Visit: Payer: Self-pay | Admitting: Family Medicine

## 2020-11-22 DIAGNOSIS — N401 Enlarged prostate with lower urinary tract symptoms: Secondary | ICD-10-CM

## 2020-12-20 DIAGNOSIS — Z23 Encounter for immunization: Secondary | ICD-10-CM | POA: Diagnosis not present

## 2020-12-20 DIAGNOSIS — S91331A Puncture wound without foreign body, right foot, initial encounter: Secondary | ICD-10-CM | POA: Diagnosis not present

## 2020-12-21 DIAGNOSIS — S91331A Puncture wound without foreign body, right foot, initial encounter: Secondary | ICD-10-CM | POA: Diagnosis not present

## 2021-02-21 ENCOUNTER — Other Ambulatory Visit: Payer: Self-pay | Admitting: Family Medicine

## 2021-02-21 DIAGNOSIS — E785 Hyperlipidemia, unspecified: Secondary | ICD-10-CM

## 2021-02-21 DIAGNOSIS — M25561 Pain in right knee: Secondary | ICD-10-CM

## 2021-02-21 DIAGNOSIS — G8929 Other chronic pain: Secondary | ICD-10-CM

## 2021-02-21 NOTE — Telephone Encounter (Signed)
Pt was last seen on 08/2020 , does pt need appt in order to get refill?

## 2021-03-23 ENCOUNTER — Other Ambulatory Visit: Payer: Self-pay

## 2021-03-23 ENCOUNTER — Ambulatory Visit: Payer: BC Managed Care – PPO | Admitting: Family Medicine

## 2021-03-23 ENCOUNTER — Ambulatory Visit
Admission: RE | Admit: 2021-03-23 | Discharge: 2021-03-23 | Disposition: A | Discharge status: Home / Self Care | Payer: BC Managed Care – PPO | Source: Ambulatory Visit | Attending: Family Medicine | Admitting: Family Medicine

## 2021-03-23 ENCOUNTER — Encounter: Payer: Self-pay | Admitting: Family Medicine

## 2021-03-23 VITALS — BP 128/88 | HR 62 | Temp 97.6°F | Resp 17 | Ht 67.0 in | Wt 187.0 lb

## 2021-03-23 DIAGNOSIS — S91332A Puncture wound without foreign body, left foot, initial encounter: Secondary | ICD-10-CM

## 2021-03-23 DIAGNOSIS — J3089 Other allergic rhinitis: Secondary | ICD-10-CM

## 2021-03-23 DIAGNOSIS — L03119 Cellulitis of unspecified part of limb: Secondary | ICD-10-CM

## 2021-03-23 MED ORDER — AZELASTINE HCL 0.1 % NA SOLN: 1.0000 | Freq: Two times a day (BID) | NASAL | 12 refills | Status: DC

## 2021-03-23 MED ORDER — CIPROFLOXACIN HCL 500 MG PO TABS: 500.0000 mg | ORAL_TABLET | Freq: Two times a day (BID) | ORAL | 0 refills | Status: AC

## 2021-03-23 MED ORDER — SULFAMETHOXAZOLE-TRIMETHOPRIM 800-160 MG PO TABS: 1.0000 | ORAL_TABLET | Freq: Two times a day (BID) | ORAL | 0 refills | Status: DC

## 2021-03-23 NOTE — Patient Instructions (Signed)
It was good to see you again today.  Please start an antibiotic for symptoms possible.  We are going to use both Septra and Cipro for now to cover various organisms that could be causing infection.  Please let me know if you are not seeing steady improvement over the next 1 to 2 days.  Also, please go to [facility] imaging at [place] to have an x-ray of your foot done today  Elevate your foot when you can, use ice and ibuprofen as needed.

## 2021-03-23 NOTE — Progress Notes (Signed)
[facility] at [facility] [place], [place],  [address] [phone]  Date:  03/23/2021   Name:  Earl Ruiz   DOB:  12-Oct-1960   MRN:  [id number]  PCP:  Darreld Mclean, MD    Chief Complaint: Foot Pain (Left foot injury,, redness, swelling)   History of Present Illness:  Earl Ruiz is a 60 y.o. very pleasant male patient who presents with the following:  Patient contacted me recently with concern of foot injury Last visit with myself in February for sinus symptoms  He sent me the following MyChart yesterday:  Hello Doc I have a medical issue I had a nail to go in my foot about a month ago that turned out  fine I took a technical  shot but last week I had a screw to go on the side of my foot I'm having pain in my big toe so can you give me a call please so we can discuss where I go from here  He works for a [employer].  He stepped on a nail with his left foot about one month ago He went to [facility] urgent care and had an x-ray and a tetanus shot Then about 2 weeks ago he punctured his left foot with a screw - it went through his boot lateral to the first metatarsal joint If that was okay, but over the last few days he has become more concerned about infection He has been using topical antibiotic cream and epson salts When he has to wear his shoes it is painful and rubs  No fever or flu like symptoms  He otherwise feels well Patient Active Problem List   Diagnosis Date Noted   Pre-diabetes 08/25/2015   Choking episode occurring at night 05/10/2014   Snoring 05/10/2014   Sleep apnea 05/10/2014    Past Medical History:  Diagnosis Date   Allergy    Choking episode occurring at night 05/10/2014   Sleep apnea 05/10/2014   Snoring 05/10/2014    Past Surgical History:  Procedure Laterality Date   EXTRACORPOREAL SHOCK WAVE LITHOTRIPSY Left 03/28/2020   Procedure: EXTRACORPOREAL SHOCK WAVE  LITHOTRIPSY (ESWL);  Surgeon: Festus Aloe, MD;  Location: [facility];  Service: Urology;  Laterality: Left;   NO PAST SURGERIES     no prior surgery      Social History   Tobacco Use   Smoking status: Never   Smokeless tobacco: Never  Substance Use Topics   Alcohol use: No    Comment: occ   Drug use: No    Family History  Problem Relation Age of Onset   Diabetes Father    Colon cancer Neg Hx     No Known Allergies  Medication list has been reviewed and updated.  Current Outpatient Medications on File Prior to Visit  Medication Sig Dispense Refill   albuterol (VENTOLIN HFA) 108 (90 Base) MCG/ACT inhaler Inhale 2 puffs into the lungs every 6 (six) hours as needed for wheezing or shortness of breath. 18 g 5   azelastine (ASTELIN) 0.1 % nasal spray Place 1 spray into both nostrils 2 (two) times daily. 30 mL 12   cetirizine-pseudoephedrine (ZYRTEC-D) 5-120 MG tablet Take 1 tablet by mouth 2 (two) times daily. 30 tablet 5   Insulin Pen Needle 32G X 4 MM MISC Use as directed with Saxenda daily. 100 each 1   Liraglutide -Weight Management (SAXENDA) 18 MG/3ML SOPN  Inject 3 mg into the skin daily. Start with 0.6 daily for one week, increase by 0.6 weekly until goal dose 5 pen 2   meloxicam (MOBIC) 15 MG tablet TAKE 1 TABLET BY MOUTH ONCE DAILY AS NEEDED FOR PAIN 30 tablet 2   oxyCODONE-acetaminophen (PERCOCET) 5-325 MG tablet Take 1-2 tablets by mouth every 4 (four) hours as needed for severe pain. 12 tablet 0   rosuvastatin (CRESTOR) 20 MG tablet Take 1 tablet by mouth once daily 90 tablet 1   tamsulosin (FLOMAX) 0.4 MG CAPS capsule TAKE 1 CAPSULE BY MOUTH EVERY DAY 90 capsule 1   No current facility-administered medications on file prior to visit.    Review of Systems:  As per HPI- otherwise negative.   Physical Examination: Vitals:   03/23/21 1348  BP: 128/88  Pulse: 62  Resp: 17  Temp: 97.6 F (36.4 C)  SpO2: 95%   Vitals:   03/23/21 1348   Weight: 187 lb (84.8 kg)  Height: '5\' 7"'$  (1.702 m)   Body mass index is 29.29 kg/m. Ideal Body Weight: Weight in (lb) to have BMI = 25: 159.3  GEN: no acute distress.  Mild overweight, looks well HEENT: Atraumatic, Normocephalic.  Ears and Nose: No external deformity. CV: RRR, No M/G/R. No JVD. No thrill. No extra heart sounds. PULM: CTA B, no wheezes, crackles, rhonchi. No retractions. No resp. distress. No accessory muscle use. EXTR: No c/c/e PSYCH: Normally interactive. Conversant.  His right foot displays a puncture wound on the lateral foot as pictured below:  The area is slightly warm, no purulent discharge from the wound.  Good pulses, the foot is warm well perfused He is able to move his toes, no particular pain with movement of the first MTP   Culture of fluid from wound is taken Assessment and Plan: Puncture wound of left foot, initial encounter - Plan: DG Foot Complete Left  Cellulitis of foot - Plan: sulfamethoxazole-trimethoprim (BACTRIM DS) 800-160 MG tablet, ciprofloxacin (CIPRO) 500 MG tablet, Wound culture  Other allergic rhinitis - Plan: azelastine (ASTELIN) 0.1 % nasal spray Patient seen today with a puncture wound of his left foot.  This occurred through his shoe at work about 2 weeks ago.  He very recently had a tetanus booster  We will start him today on dual coverage for Pseudomonas and MRSA with Bactrim and Cipro.  X-ray of foot ordered for today.  I encouraged him to stay home from his part-time job this weekend so he can rest and elevate his foot.  He will alert me if not seeing definite progress over the next 1 to 2 days, sooner if worse  Also, he notes that he will oftentimes wake up in the night with nasal congestion.  Admits he is using at present fairly consistently.  Advised he may be having some rebound nasal congestion/rhinitis medicamentosa.   Recommended that he stop using chronic Afrin and uses Astelin instead  Signed Lamar Blinks, MD

## 2021-03-27 ENCOUNTER — Encounter: Payer: Self-pay | Admitting: Family Medicine

## 2021-03-27 LAB — WOUND CULTURE
MICRO NUMBER:: 12347479
SPECIMEN QUALITY:: ADEQUATE

## 2021-03-31 ENCOUNTER — Telehealth: Payer: Self-pay | Admitting: Family Medicine

## 2021-03-31 NOTE — Telephone Encounter (Signed)
Forms received, will give to Dr Lorelei Pont when she returns.

## 2021-03-31 NOTE — Telephone Encounter (Signed)
Pt dropped off document to be filled out by provider (3 pages Healthcare provider certification for employee- in a gray big folder) Pt would like to be called when document ready to pick up at [facility] [phone]. Document put at front office tray under providers name.

## 2021-04-16 NOTE — Progress Notes (Deleted)
[facility] at [facility] [address] [place], [place] [address] [phone]  Date:  04/20/2021   Name:  Earl Ruiz   DOB:  Sep 04, 1960   MRN:  [id number]  PCP:  Darreld Mclean, MD    Chief Complaint: No chief complaint on file.   History of Present Illness:  Earl Ruiz is a 60 y.o. very pleasant male patient who presents with the following:  Patient seen today for physical exam Most recent visit with myself last month for a foot injury, previously seen in February for sinus infection History of prediabetes, sleep apnea, dyslipidemia  COVID booster Flu vaccine Colonoscopy is up-to-date Shingrix is complete Can update labs today Patient Active Problem List   Diagnosis Date Noted   Pre-diabetes 08/25/2015   Choking episode occurring at night 05/10/2014   Snoring 05/10/2014   Sleep apnea 05/10/2014    Past Medical History:  Diagnosis Date   Allergy    Choking episode occurring at night 05/10/2014   Sleep apnea 05/10/2014   Snoring 05/10/2014    Past Surgical History:  Procedure Laterality Date   EXTRACORPOREAL SHOCK WAVE LITHOTRIPSY Left 03/28/2020   Procedure: EXTRACORPOREAL SHOCK WAVE LITHOTRIPSY (ESWL);  Surgeon: Festus Aloe, MD;  Location: [facility];  Service: Urology;  Laterality: Left;   NO PAST SURGERIES     no prior surgery      Social History   Tobacco Use   Smoking status: Never   Smokeless tobacco: Never  Substance Use Topics   Alcohol use: No    Comment: occ   Drug use: No    Family History  Problem Relation Age of Onset   Diabetes Father    Colon cancer Neg Hx     No Known Allergies  Medication list has been reviewed and updated.  Current Outpatient Medications on File Prior to Visit  Medication Sig Dispense Refill   albuterol (VENTOLIN HFA) 108 (90 Base) MCG/ACT inhaler Inhale 2 puffs into the lungs every 6 (six) hours as needed for wheezing or shortness  of breath. 18 g 5   azelastine (ASTELIN) 0.1 % nasal spray Place 1 spray into both nostrils 2 (two) times daily. 30 mL 12   cetirizine-pseudoephedrine (ZYRTEC-D) 5-120 MG tablet Take 1 tablet by mouth 2 (two) times daily. 30 tablet 5   Insulin Pen Needle 32G X 4 MM MISC Use as directed with Saxenda daily. 100 each 1   Liraglutide -Weight Management (SAXENDA) 18 MG/3ML SOPN Inject 3 mg into the skin daily. Start with 0.6 daily for one week, increase by 0.6 weekly until goal dose 5 pen 2   meloxicam (MOBIC) 15 MG tablet TAKE 1 TABLET BY MOUTH ONCE DAILY AS NEEDED FOR PAIN 30 tablet 2   rosuvastatin (CRESTOR) 20 MG tablet Take 1 tablet by mouth once daily 90 tablet 1   sulfamethoxazole-trimethoprim (BACTRIM DS) 800-160 MG tablet Take 1 tablet by mouth 2 (two) times daily. 20 tablet 0   tamsulosin (FLOMAX) 0.4 MG CAPS capsule TAKE 1 CAPSULE BY MOUTH EVERY DAY 90 capsule 1   No current facility-administered medications on file prior to visit.    Review of Systems:  As per HPI- otherwise negative.   Physical Examination: There were no vitals filed for this visit. There were no vitals filed for this visit. There is no height or weight on file to calculate BMI. Ideal Body Weight:    GEN: no acute distress. HEENT: Atraumatic, Normocephalic.  Ears and Nose: No external deformity. CV: RRR, No M/G/R. No JVD. No thrill. No extra heart sounds. PULM: CTA B, no wheezes, crackles, rhonchi. No retractions. No resp. distress. No accessory muscle use. ABD: S, NT, ND, +BS. No rebound. No HSM. EXTR: No c/c/e PSYCH: Normally interactive. Conversant.    Assessment and Plan: *** Physical exam today.  Encouraged healthy diet and exercise routine Signed Lamar Blinks, MD

## 2021-04-17 ENCOUNTER — Encounter: Payer: BC Managed Care – PPO | Admitting: Family Medicine

## 2021-04-20 ENCOUNTER — Encounter: Payer: BC Managed Care – PPO | Admitting: Family Medicine

## 2021-04-20 DIAGNOSIS — N401 Enlarged prostate with lower urinary tract symptoms: Secondary | ICD-10-CM

## 2021-04-20 DIAGNOSIS — Z130 Encounter for screening for diseases of the blood and blood-forming organs and certain disorders involving the immune mechanism: Secondary | ICD-10-CM

## 2021-04-20 DIAGNOSIS — Z0000 Encounter for general adult medical examination without abnormal findings: Secondary | ICD-10-CM

## 2021-04-20 DIAGNOSIS — R7303 Prediabetes: Secondary | ICD-10-CM

## 2021-04-20 DIAGNOSIS — E785 Hyperlipidemia, unspecified: Secondary | ICD-10-CM

## 2021-04-20 DIAGNOSIS — Z125 Encounter for screening for malignant neoplasm of prostate: Secondary | ICD-10-CM

## 2021-05-02 NOTE — Patient Instructions (Signed)
It was great to see you today, I will be in touch with your labs as soon as possible You are all up to day on vaccinations now Colon due in late 2024 Keep up the good work with physical activity We will get you in with neurology for a sleep evaluation

## 2021-05-02 NOTE — Progress Notes (Signed)
[place] at [facility] [address], [place], [place] [address] [phone]  Date:  05/08/2021   Name:  Earl Ruiz   DOB:  [birth year]   MRN:  [id number]  PCP:  Darreld Mclean, MD    Chief Complaint: Annual Exam (Concerns/ questions: Refill on nasal spray/Flu shot today: yes/)   History of Present Illness:  Earl Ruiz is a 60 y.o. very pleasant male patient who presents with the following:  Patient seen today for physical exam-history of prediabetes, sleep apnea Most recent visit with myself last month for a foot injury and infection He is all healed now. His feet may hurt at the end of a long day of walking but otherwise it seems fine He may walk up to about 8k steps a day No CP or SOB   COVID-19 new booster - done  Flu vaccine- give today  Shingrix is complete, colonoscopy up-to-date BMP, CBC, A1c in February- he is fasting this am  No family history of prostate cancer  He has more difficulty with his breathing over the summer when allergies are more severe.  Otherwise he does not feel like OSA is bothering him much  He is using astelin spray He would like to have a repeat sleep study- he does not have a CPAP machine     Patient Active Problem List   Diagnosis Date Noted   Pre-diabetes 08/25/2015   Choking episode occurring at night 05/10/2014   Snoring 05/10/2014   Sleep apnea 05/10/2014    Past Medical History:  Diagnosis Date   Allergy    Choking episode occurring at night 05/10/2014   Sleep apnea 05/10/2014   Snoring 05/10/2014    Past Surgical History:  Procedure Laterality Date   EXTRACORPOREAL SHOCK WAVE LITHOTRIPSY Left 03/28/2020   Procedure: EXTRACORPOREAL SHOCK WAVE LITHOTRIPSY (ESWL);  Surgeon: Festus Aloe, MD;  Location: [facility];  Service: Urology;  Laterality: Left;   NO PAST SURGERIES     no prior surgery      Social History   Tobacco Use   Smoking  status: Never   Smokeless tobacco: Never  Substance Use Topics   Alcohol use: No    Comment: occ   Drug use: No    Family History  Problem Relation Age of Onset   Diabetes Father    Colon cancer Neg Hx     No Known Allergies  Medication list has been reviewed and updated.  Current Outpatient Medications on File Prior to Visit  Medication Sig Dispense Refill   albuterol (VENTOLIN HFA) 108 (90 Base) MCG/ACT inhaler Inhale 2 puffs into the lungs every 6 (six) hours as needed for wheezing or shortness of breath. 18 g 5   azelastine (ASTELIN) 0.1 % nasal spray Place 1 spray into both nostrils 2 (two) times daily. 30 mL 12   cetirizine-pseudoephedrine (ZYRTEC-D) 5-120 MG tablet Take 1 tablet by mouth 2 (two) times daily. 30 tablet 5   Insulin Pen Needle 32G X 4 MM MISC Use as directed with Saxenda daily. 100 each 1   Liraglutide -Weight Management (SAXENDA) 18 MG/3ML SOPN Inject 3 mg into the skin daily. Start with 0.6 daily for one week, increase by 0.6 weekly until goal dose 5 pen 2   meloxicam (MOBIC) 15 MG tablet TAKE 1 TABLET BY MOUTH ONCE DAILY AS NEEDED FOR PAIN 30 tablet 2   rosuvastatin (CRESTOR) 20 MG tablet Take 1 tablet by mouth  once daily 90 tablet 1   tamsulosin (FLOMAX) 0.4 MG CAPS capsule TAKE 1 CAPSULE BY MOUTH EVERY DAY 90 capsule 1   No current facility-administered medications on file prior to visit.    Review of Systems:  As per HPI- otherwise negative.   Physical Examination: Vitals:   05/08/21 0900  BP: (!) 144/84  Pulse: 87  Resp: 18  Temp: 97.6 F (36.4 C)  SpO2: 98%   Vitals:   05/08/21 0900  Weight: 189 lb 9.6 oz (86 kg)  Height: 5\' 7"  (1.702 m)   Body mass index is 29.7 kg/m. Ideal Body Weight: Weight in (lb) to have BMI = 25: 159.3  GEN: no acute distress. Overweight Bilateral TM wnl, oropharynx normal.  PEERL,EOMI.   HEENT: Atraumatic, Normocephalic.  Ears and Nose: No external deformity. CV: RRR, No M/G/R. No JVD. No thrill. No  extra heart sounds. PULM: CTA B, no wheezes, crackles, rhonchi. No retractions. No resp. distress. No accessory muscle use. ABD: S, NT, ND, +BS. No rebound. No HSM. EXTR: No c/c/e PSYCH: Normally interactive. Conversant.  Infection of left foot is all cleared, normal pulses bilaterally   Assessment and Plan: Physical exam  Pre-diabetes - Plan: Comprehensive metabolic panel, Hemoglobin A1c  Dyslipidemia - Plan: Lipid panel, rosuvastatin (CRESTOR) 20 MG tablet  Screening for deficiency anemia - Plan: CBC  Benign prostatic hyperplasia with urinary frequency - Plan: PSA, tamsulosin (FLOMAX) 0.4 MG CAPS capsule  Screening for prostate cancer - Plan: PSA  Need for influenza vaccination - Plan: Flu Vaccine QUAD 6+ mos PF IM (Fluarix Quad PF)  Snoring - Plan: Ambulatory referral to Neurology  Chronic pain of right knee - Plan: meloxicam (MOBIC) 15 MG tablet  Other allergic rhinitis - Plan: albuterol (VENTOLIN HFA) 108 (90 Base) MCG/ACT inhaler  Physical exam today.  Encouraged healthy diet and exercise routine Will plan further follow- up pending labs. Colon UTD Flu shot today Covid UTD  BP Readings from Last 3 Encounters:  05/08/21 138/85  03/23/21 128/88  09/08/20 118/80     Signed Lamar Blinks, MD  Received his labs as below, message to patient  Results for orders placed or performed in visit on 05/08/21  CBC  Result Value Ref Range   WBC 6.6 4.0 - 10.5 K/uL   RBC 4.57 4.22 - 5.81 Mil/uL   Platelets 339.0 150.0 - 400.0 K/uL   Hemoglobin 13.4 13.0 - 17.0 g/dL   HCT 40.9 39.0 - 52.0 %   MCV 89.7 78.0 - 100.0 fl   MCHC 32.6 30.0 - 36.0 g/dL   RDW 13.5 11.5 - 15.5 %  Comprehensive metabolic panel  Result Value Ref Range   Sodium 138 135 - 145 mEq/L   Potassium 4.5 3.5 - 5.1 mEq/L   Chloride 103 96 - 112 mEq/L   CO2 26 19 - 32 mEq/L   Glucose, Bld 109 (H) 70 - 99 mg/dL   BUN 11 6 - 23 mg/dL   Creatinine, Ser 0.74 0.40 - 1.50 mg/dL   Total Bilirubin 0.4 0.2 -  1.2 mg/dL   Alkaline Phosphatase 68 39 - 117 U/L   AST 18 0 - 37 U/L   ALT 17 0 - 53 U/L   Total Protein 7.8 6.0 - 8.3 g/dL   Albumin 4.9 3.5 - 5.2 g/dL   GFR 99.03 >60.00 mL/min   Calcium 9.9 8.4 - 10.5 mg/dL  Hemoglobin A1c  Result Value Ref Range   Hgb A1c MFr Bld 5.8 4.6 - 6.5 %  Lipid panel  Result Value Ref Range   Cholesterol 170 0 - 200 mg/dL   Triglycerides 59.0 0.0 - 149.0 mg/dL   HDL 52.80 >39.00 mg/dL   VLDL 11.8 0.0 - 40.0 mg/dL   LDL Cholesterol 105 (H) 0 - 99 mg/dL   Total CHOL/HDL Ratio 3    NonHDL 117.21   PSA  Result Value Ref Range   PSA 0.42 0.10 - 4.00 ng/mL

## 2021-05-08 ENCOUNTER — Ambulatory Visit (INDEPENDENT_AMBULATORY_CARE_PROVIDER_SITE_OTHER): Payer: BC Managed Care – PPO | Admitting: Family Medicine

## 2021-05-08 ENCOUNTER — Encounter: Payer: Self-pay | Admitting: Family Medicine

## 2021-05-08 ENCOUNTER — Other Ambulatory Visit: Payer: Self-pay

## 2021-05-08 VITALS — BP 138/85 | HR 87 | Temp 97.6°F | Resp 18 | Ht 67.0 in | Wt 189.6 lb

## 2021-05-08 DIAGNOSIS — N401 Enlarged prostate with lower urinary tract symptoms: Secondary | ICD-10-CM

## 2021-05-08 DIAGNOSIS — Z23 Encounter for immunization: Secondary | ICD-10-CM | POA: Diagnosis not present

## 2021-05-08 DIAGNOSIS — R350 Frequency of micturition: Secondary | ICD-10-CM

## 2021-05-08 DIAGNOSIS — M25561 Pain in right knee: Secondary | ICD-10-CM

## 2021-05-08 DIAGNOSIS — J3089 Other allergic rhinitis: Secondary | ICD-10-CM

## 2021-05-08 DIAGNOSIS — Z130 Encounter for screening for diseases of the blood and blood-forming organs and certain disorders involving the immune mechanism: Secondary | ICD-10-CM

## 2021-05-08 DIAGNOSIS — Z125 Encounter for screening for malignant neoplasm of prostate: Secondary | ICD-10-CM

## 2021-05-08 DIAGNOSIS — R7303 Prediabetes: Secondary | ICD-10-CM | POA: Diagnosis not present

## 2021-05-08 DIAGNOSIS — Z0000 Encounter for general adult medical examination without abnormal findings: Secondary | ICD-10-CM

## 2021-05-08 DIAGNOSIS — R0683 Snoring: Secondary | ICD-10-CM

## 2021-05-08 DIAGNOSIS — G8929 Other chronic pain: Secondary | ICD-10-CM

## 2021-05-08 DIAGNOSIS — E785 Hyperlipidemia, unspecified: Secondary | ICD-10-CM

## 2021-05-08 LAB — COMPREHENSIVE METABOLIC PANEL
ALT: 17 U/L (ref 0–53)
AST: 18 U/L (ref 0–37)
Albumin: 4.9 g/dL (ref 3.5–5.2)
Alkaline Phosphatase: 68 U/L (ref 39–117)
BUN: 11 mg/dL (ref 6–23)
CO2: 26 mEq/L (ref 19–32)
Calcium: 9.9 mg/dL (ref 8.4–10.5)
Chloride: 103 mEq/L (ref 96–112)
Creatinine, Ser: 0.74 mg/dL (ref 0.40–1.50)
GFR: 99.03 mL/min (ref >60.00)
Glucose, Bld: 109 mg/dL — ABNORMAL HIGH (ref 70–99)
Potassium: 4.5 mEq/L (ref 3.5–5.1)
Sodium: 138 mEq/L (ref 135–145)
Total Bilirubin: 0.4 mg/dL (ref 0.2–1.2)
Total Protein: 7.8 g/dL (ref 6.0–8.3)

## 2021-05-08 LAB — LIPID PANEL
Cholesterol: 170 mg/dL (ref 0–200)
HDL: 52.8 mg/dL (ref >39.00)
LDL Cholesterol: 105 mg/dL — ABNORMAL HIGH (ref 0–99)
NonHDL: 117.21
Total CHOL/HDL Ratio: 3
Triglycerides: 59 mg/dL (ref 0.0–149.0)
VLDL: 11.8 mg/dL (ref 0.0–40.0)

## 2021-05-08 LAB — CBC
HCT: 40.9 % (ref 39.0–52.0)
Hemoglobin: 13.4 g/dL (ref 13.0–17.0)
MCHC: 32.6 g/dL (ref 30.0–36.0)
MCV: 89.7 fl (ref 78.0–100.0)
Platelets: 339 10*3/uL (ref 150.0–400.0)
RBC: 4.57 Mil/uL (ref 4.22–5.81)
RDW: 13.5 % (ref 11.5–15.5)
WBC: 6.6 10*3/uL (ref 4.0–10.5)

## 2021-05-08 LAB — PSA: PSA: 0.42 ng/mL (ref 0.10–4.00)

## 2021-05-08 LAB — HEMOGLOBIN A1C: Hgb A1c MFr Bld: 5.8 % (ref 4.6–6.5)

## 2021-05-08 MED ORDER — TAMSULOSIN HCL 0.4 MG PO CAPS: 0.4000 mg | ORAL_CAPSULE | Freq: Every day | ORAL | 3 refills | Status: DC

## 2021-05-08 MED ORDER — MELOXICAM 15 MG PO TABS: ORAL_TABLET | ORAL | 2 refills | Status: DC

## 2021-05-08 MED ORDER — ALBUTEROL SULFATE HFA 108 (90 BASE) MCG/ACT IN AERS: 2.0000 | INHALATION_SPRAY | Freq: Four times a day (QID) | RESPIRATORY_TRACT | 5 refills | Status: AC | PRN

## 2021-05-08 MED ORDER — ROSUVASTATIN CALCIUM 20 MG PO TABS: 20.0000 mg | ORAL_TABLET | Freq: Every day | ORAL | 3 refills | Status: DC

## 2021-05-22 ENCOUNTER — Other Ambulatory Visit: Payer: Self-pay | Admitting: Family Medicine

## 2021-05-22 DIAGNOSIS — R350 Frequency of micturition: Secondary | ICD-10-CM

## 2021-05-22 DIAGNOSIS — N401 Enlarged prostate with lower urinary tract symptoms: Secondary | ICD-10-CM

## 2021-07-27 ENCOUNTER — Ambulatory Visit: Payer: BC Managed Care – PPO | Admitting: Neurology

## 2021-07-27 ENCOUNTER — Other Ambulatory Visit: Payer: Self-pay

## 2021-07-27 ENCOUNTER — Encounter: Payer: Self-pay | Admitting: Neurology

## 2021-07-27 VITALS — BP 124/78 | HR 82 | Ht 67.0 in | Wt 194.5 lb

## 2021-07-27 DIAGNOSIS — G4733 Obstructive sleep apnea (adult) (pediatric): Secondary | ICD-10-CM

## 2021-07-27 DIAGNOSIS — J322 Chronic ethmoidal sinusitis: Secondary | ICD-10-CM | POA: Diagnosis not present

## 2021-07-27 DIAGNOSIS — R0989 Other specified symptoms and signs involving the circulatory and respiratory systems: Secondary | ICD-10-CM

## 2021-07-27 DIAGNOSIS — R0683 Snoring: Secondary | ICD-10-CM

## 2021-07-27 NOTE — Progress Notes (Signed)
SLEEP MEDICINE CLINIC    Provider:  Larey Seat, MD  Primary Care Physician:  Earl Ruiz, [address] [place] [address]     Referring Provider: Darreld Ruiz, [address] [place],  [place] [id number]          Chief Complaint according to patient   Patient presents with:     New Patient (Initial Visit)           HISTORY OF PRESENT ILLNESS:  Earl Ruiz is a 61 y.o. year old 43 or African American male patient seen here as a referral on 07/27/2021 from Earl Ruiz, Consultation on 07-27-2021.   Chief concern according to patient :  last seen in 2015, now here for re-evaluation. He is a Games developer , working 12 hour shifts, commuting 1 hour back and forth.   For the last 3 years he has a lot more sinus congestion and nasal congestion, he now has nocturia , on flomax.     Earl Ruiz  has a past medical history of Allergy, Choking episode occurring at night (05/10/2014), Sleep apnea (05/10/2014), and Snoring (05/10/2014). Tonsils were large.    The patient had the first sleep study in the year 2015. The result were documented in EPIC.  Earl Ruiz has witnessed ongoing apneas his SPECT study polysomnography from 12-2 2015 at [facility] sleep showed a baseline AHI of 47.2 no REM sleep and in supine his apnea index was 61.9 which equals 1 apnea a minute of sleep when on his back.  Limb movements were also noted but very very mild there was no abnormality in heart rate, CPAP was initiated at 5 and titrated to 9 cmH2O then the AHI was reduced to 2.1/h.  Sleep efficiency was 93% on CPAP.  The patient began having a few more periodic limb movements on the CPAP and he reports that he did not tolerated that well.  We ordered an CPAP at 11 cmH2O present is some mask and medium size heated humidity but there were insurance coverage issues and she never received the CPAP for home use.  His  body mass index  was higher, his weight  was over 207 pounds when I saw him in 2015.  So he had actually little bit lower at 190 pounds now.    Sleep relevant medical history: Nocturia/ 1-3,     Family medical /sleep history: No other family member on CPAP with OSA, insomnia, sleep walkers.    Social history:  Patient is working as a Games developer, living with spouse, no children.  No pets,  The patient currently works/ used to work in shifts, 5.45 AM to 18.15 .   Tobacco use: none .  ETOH use ; 1 beer a day-,  Caffeine intake in form of Coffee( 2 cups a day) Soda( /) Tea ( /) or energy drinks. Regular exercise in form of working, steps 7-9 k a day.    See 2015 VISIT :HPI:  Earl Ruiz is a 61 y.o. male , who is seen here as a referral from Earl. Lorelei Ruiz for a sleep consultation,  Earl Ruiz reports that he is snoring at night and his wife has witnessed this, and that he feels sometimes short of breath while sleeping he describes his sleep choking sensation. His wife also has witnessed him gasping for breath.  About 2 years ago he changed a lot of his diet, he was successful  in losing weight and controlling his cholesterolemia and he  regularly works out.  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 He reports neither sleepiness, nor fatigue. He goes to bed at 10.30  PM and sleeps soon after, sleeps through the night,  unless he has choking or louder snoring episodes. He has a one time nocturia, sometimes 2. His wife reports he snores on his back and on his side. Not every night. He rises 4 days a week before 7 AM, he wakes with an alarm.  On weekends 3 AM , as he works another second job on weekends. He rarely dreams.   The bedroom is quiet and dark, and cool. He has allergic seasonal rhinitis. He has no known neck injuries, facial injuries or ENT surgery.       Sleep habits are as follows: The patient's dinner time is between 5-9 PM. The patient goes to bed at 10.30 PM and promptly falls  asleep-continues to sleep for 4-7 hours, wakes for 1-2 bathroom breaks, the first time at 2 AM.  Sometimes reports GERD.  The preferred sleep position is variable, with the support of 1 pillow.  Dreams are reportedly rare.  5-7  AM is the usual rise time. The patient wakes up with an alarm.  He reports  feeling refreshed or restored in AM, with symptoms such as dry mouth, rare morning headaches when sinus congestion, and residual fatigue.  Naps are taken frequently when he has an opportunity-, lasting from 1 to 2 hours.    Review of Systems: Out of a complete 14 system review, the patient complains of only the following symptoms, and all other reviewed systems are negative.:  Fatigue, sleepiness , snoring, choking,   Snoring louder than in 2015.    How likely are you to doze in the following situations: 0 = not likely, 1 = slight chance, 2 = moderate chance, 3 = high chance   Sitting and Reading? Watching Television? Sitting inactive in a public place (theater or meeting)? As a passenger in a car for an hour without a break? Lying down in the afternoon when circumstances permit? Sitting and talking to someone? Sitting quietly after lunch without alcohol? In a car, while stopped for a few minutes in traffic?   Total = 6-8/ 24 points   FSS endorsed at 20/ 63 points.      Social History   Socioeconomic History   Marital status: Unknown    Spouse name: Not on file   Number of children: Not on file   Years of education: Not on file   Highest education level: Not on file  Occupational History   Not on file  Tobacco Use   Smoking status: Never   Smokeless tobacco: Never  Substance and Sexual Activity   Alcohol use: Yes    Comment: occ beer   Drug use: No   Sexual activity: Not on file  Other Topics Concern   Not on file  Social History Narrative   Right handed.     Caffeine 2-3 cups daily    Married, no kids.  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    Social Determinants of Health    Financial Resource Strain: Not on file  Food Insecurity: Not on file  Transportation Needs: Not on file  Physical Activity: Not on file  Stress: Not on file  Social Connections: Not on file    Family History  Problem Relation Age of Onset  Diabetes Father    Colon cancer Neg Hx     Past Medical History:  Diagnosis Date   Allergy    Choking episode occurring at night 05/10/2014   Sleep apnea 05/10/2014   Snoring 05/10/2014    Past Surgical History:  Procedure Laterality Date   EXTRACORPOREAL SHOCK WAVE LITHOTRIPSY Left 03/28/2020   Procedure: EXTRACORPOREAL SHOCK WAVE LITHOTRIPSY (ESWL);  Surgeon: Festus Aloe, MD;  Location: [facility];  Service: Urology;  Laterality: Left;     Current Outpatient Medications on File Prior to Visit  Medication Sig Dispense Refill   albuterol (VENTOLIN HFA) 108 (90 Base) MCG/ACT inhaler Inhale 2 puffs into the lungs every 6 (six) hours as needed for wheezing or shortness of breath. 18 g 5   azelastine (ASTELIN) 0.1 % nasal spray Place 1 spray into both nostrils 2 (two) times daily. 30 mL 12   cetirizine-pseudoephedrine (ZYRTEC-D) 5-120 MG tablet Take 1 tablet by mouth 2 (two) times daily. 30 tablet 5   meloxicam (MOBIC) 15 MG tablet TAKE 1 TABLET BY MOUTH ONCE DAILY AS NEEDED FOR PAIN 90 tablet 2   rosuvastatin (CRESTOR) 20 MG tablet Take 1 tablet (20 mg total) by mouth daily. 90 tablet 3   tamsulosin (FLOMAX) 0.4 MG CAPS capsule Take 1 capsule (0.4 mg total) by mouth daily. 90 capsule 3   No current facility-administered medications on file prior to visit.   Physical exam:  Today's Vitals   07/27/21 1055  BP: 124/78  Pulse: 82  SpO2: 98%  Weight: 194 lb 8 oz (88.2 kg)  Height: 5\' 7"  (1.702 m)   Body mass index is 30.46 kg/m.   Wt Readings from Last 3 Encounters:  07/27/21 194 lb 8 oz (88.2 kg)  05/08/21 189 lb 9.6 oz (86 kg)  03/23/21 187 lb (84.8 kg)     Ht Readings from Last 3 Encounters:  07/27/21  5\' 7"  (1.702 m)  05/08/21 5\' 7"  (1.702 m)  03/23/21 5\' 7"  (1.702 m)      General: The patient is awake, alert and appears not in acute distress. The patient is well groomed. Head: Normocephalic, atraumatic. Neck is supple.  Mallampati 2- deep red.   neck circumference:16 inches . Nasal airflow not patent.  Retrognathia is mild   Dental status: partial dentures.  Cardiovascular:  Regular rate and cardiac rhythm by pulse,  without distended neck veins. Respiratory: Lungs are clear to auscultation.  Skin:  Without evidence of ankle edema, or rash. Trunk: The patient's posture is erect.   Neurologic exam : The patient is awake and alert, oriented to place and time.   Memory subjective described as intact.  Attention span & concentration ability appears normal.  Speech is fluent,  without  dysarthria, but nasal dysphonia.  Mood and affect are appropriate.   Cranial nerves: no loss of smell or taste reported  Pupils are equal and briskly reactive to light. Funduscopic exam deferred. .  Extraocular movements in vertical and horizontal planes were intact and without nystagmus. No Diplopia. Visual fields by finger perimetry are intact. Hearing was intact to soft voice and finger rubbing.    Facial sensation intact to fine touch.  Facial motor strength is symmetric and tongue and uvula move midline.  Neck ROM : rotation, tilt and flexion extension were normal for age and shoulder shrug was symmetrical.    Motor exam:  Symmetric bulk, tone and ROM.   Normal tone without cog wheeling, symmetric grip strength .   Sensory:  Fine touch and vibration were tested  and  normal.  Proprioception tested in the upper extremities was normal.   Coordination: Rapid alternating movements in the fingers/hands were of normal speed.  The Finger-to-nose maneuver was intact without evidence of ataxia, dysmetria or tremor.   Gait and station: Patient could rise unassisted from a seated position, walked  without assistive device.  Stance is of normal width/ base and the patient turned with 3 steps.  Toe and heel walk were deferred.  Deep tendon reflexes: in the  upper and lower extremities are symmetric and intact.  Babinski response was deferred .        After spending a total time of  45  minutes face to face and additional time for physical and neurologic examination, review of laboratory studies,  personal review of imaging studies, reports and results of other testing and review of referral information / records as far as provided in visit, I have established the following assessments:    1) 2015 previously diagnosed with severe apnea/ OSA. Insurance was not covering CPAP enough for hi to be affordable.   2) We need to look at a new baseline study, I would be happy with either SPLIT or HST.  3) ESON nasal mask worked in 2015, but now he has nasal congestion, sinus congestion. Not seasonal.    My Plan is to proceed with:  1) HST referred as more affordable alternative to I lab study ?  2) sinusitis prevention, mucinex- not D- and zyrtec daily. Netty pot. He uses all of it, but never was tested for alleriigies  3) will need follow up in 2-4 months, either with MD or NP.   I would like to thank Copland, Gay Filler, MD , [place],  [place] [id number] for allowing me to meet with and to take care of this pleasant patient.      Electronically signed by: Larey Seat, MD 07/27/2021 11:28 AM  [facility] Board certified by The [employer] of Sleep Medicine and Diplomate of the [employer] of Sleep Medicine. Board certified In Neurology through the [place], Fellow of the [employer] of Neurology. Medical Director of [employer].

## 2021-07-27 NOTE — Patient Instructions (Signed)
Screening for Sleep Apnea Sleep apnea is a condition in which breathing pauses or becomes shallow during sleep. Sleep apnea screening is a test to determine if you are at risk for sleep apnea. The test includes a series of questions. It will only takes a few minutes. Your health care provider may ask you to have this test in preparation for surgery or as part of a physical exam. What are the symptoms of sleep apnea? Common symptoms of sleep apnea include: Snoring. Waking up often at night. Daytime sleepiness. Pauses in breathing. Choking or gasping during sleep. Irritability. Forgetfulness. Trouble thinking clearly. Depression. Personality changes. Most people with sleep apnea do not know that they have it. What are the advantages of sleep apnea screening? Getting screened for sleep apnea can help: Ensure your safety. It is important for your health care providers to know whether or not you have sleep apnea, especially if you are having surgery or have other long-term (chronic) health conditions. Improve your health and allow you to get a better night's rest. Restful sleep can help you: Have more energy. Lose weight. Improve high blood pressure. Improve diabetes management. Prevent stroke. Prevent car accidents. What happens during the screening? Screening usually includes being asked a list of questions about your sleep quality. Some questions you may be asked include: Do you snore? Is your sleep restless? Do you have daytime sleepiness? Has a partner or spouse told you that you stop breathing during sleep? Have you had trouble concentrating or memory loss? What is your age? What is your neck circumference? To measure your neck, keep your back straight and gently wrap the tape measure around your neck. Put the tape measure at the middle of your neck, between your chin and collarbone. What is your sex assigned at birth? Do you have or are you being treated for high blood  pressure? If your screening test is positive, you are at risk for the condition. Further testing may be needed to confirm a diagnosis of sleep apnea. Where to find more information You can find screening tools online or at your health care clinic. For more information about sleep apnea screening and healthy sleep, visit these websites: Centers for Disease Control and Prevention: www.cdc.gov American Sleep Apnea Association: www.sleepapnea.org Contact a health care provider if: You think that you may have sleep apnea. Summary Sleep apnea screening can help determine if you are at risk for sleep apnea. It is important for your health care providers to know whether or not you have sleep apnea, especially if you are having surgery or have other chronic health conditions. You may be asked to take a screening test for sleep apnea in preparation for surgery or as part of a physical exam. This information is not intended to replace advice given to you by your health care provider. Make sure you discuss any questions you have with your health care provider. Document Revised: 06/10/2020 Document Reviewed: 06/10/2020 Elsevier Patient Education  2022 Elsevier Inc.  

## 2021-08-28 ENCOUNTER — Ambulatory Visit: Payer: BC Managed Care – PPO | Admitting: Neurology

## 2021-08-28 DIAGNOSIS — R0989 Other specified symptoms and signs involving the circulatory and respiratory systems: Secondary | ICD-10-CM

## 2021-08-28 DIAGNOSIS — J322 Chronic ethmoidal sinusitis: Secondary | ICD-10-CM

## 2021-08-28 DIAGNOSIS — G4733 Obstructive sleep apnea (adult) (pediatric): Secondary | ICD-10-CM | POA: Diagnosis not present

## 2021-08-28 DIAGNOSIS — R0683 Snoring: Secondary | ICD-10-CM

## 2021-08-30 NOTE — Progress Notes (Signed)
[facility] TEST REPORT ( by Watch PAT)   STUDY DATE:  08-30-2021   ORDERING CLINICIAN: Larey Seat, MD  REFERRING CLINICIAN: Dr Lorelei Pont,   CLINICAL INFORMATION/HISTORY: Earl Ruiz is a 61 year old  African American male patient who was seen here upon referral by PCP on 07/27/2021 in Consultation.   Chief concern according to patient :  last seen in 2015, now here for re-evaluation. He is a Games developer , working 12 hour shifts, commuting 1 hour back and forth.   For the last 3 years he has a lot more sinus congestion and nasal congestion, he now has nocturia , on flomax.    Earl Ruiz has a medical history of Allergy, Choking episode occurring at night (05/10/2014), Sleep apnea (05/10/2014), and Snoring (05/10/2014). Tonsils were large.     Epworth sleepiness score: 8/24.   BMI: 30.8 kg/m   Neck Circumference: 16"   FINDINGS:   Sleep Summary:   Total Recording Time (hours, min): Total recording time amounted to 7 hours 32 minutes with a total sleep time of 6 hours 25 minutes.  21.1% of this recording's sleep time were REM sleep.                            Respiratory Indices:   Calculated pAHI (per hour): 14.1                            REM pAHI: 28.3                                               NREM pAHI:    10.4                          Positional AHI:   Supine sleep was associated with an apnea hypopnea index of 46.1 while sleeping on the right side was only producing an AHI of 9.2/h.  Snoring reached a mean volume of 40 dB and was only present for 6.4% of sleep time.                                                Oxygen Saturation Statistics:          O2 Saturation Range (%): Between a nadir of 90 and a maximum of 99% saturation with a mean oxygen saturation of 95%.                                     O2 Saturation (minutes) <89%: 0 minutes          Pulse Rate Statistics:   Pulse Mean (bpm): 69 bpm                Pulse Range: Between 52 and 99 bpm               IMPRESSION:  This HST confirms the presence of mild obstructive sleep apnea with a strong REM component but an even stronger supine  sleep dependency.  There was no significant hypoxia, heart rate variability was in normal range, snoring was present for a rather short time of total sleep time.   RECOMMENDATION: There is more than 1 approach to this mild sleep apnea.  #1 nasal patency seems to be a problem and treating that may already improve apnea and snoring significantly.  This patient may need allergy testing to identify the cause of his frequent rhinitis and sinusitis.    The patient should avoid sleeping in supine position ;this alone seems to reduce his overall AHI to 10 or less per hour. REM sleep dependent apnea usually calls for CPAP use, but nasal patency is important to tolerate CPAP at all. Once his nasal patency and sinusitis has been addressed it would be worthwhile retesting for sleep apnea.  In the meantime I just want him to stay off his back and will not start CPAP.  I would like for the patient to visit with me again after allergy testing may be in the next 2 to 4 months?     INTERPRETING PHYSICIAN:   Larey Seat, MD   Medical Director of [facility] at [facility].

## 2021-09-11 ENCOUNTER — Telehealth: Payer: Self-pay | Admitting: Neurology

## 2021-09-11 ENCOUNTER — Telehealth: Payer: Self-pay

## 2021-09-11 ENCOUNTER — Ambulatory Visit (INDEPENDENT_AMBULATORY_CARE_PROVIDER_SITE_OTHER): Payer: BC Managed Care – PPO | Admitting: Allergy

## 2021-09-11 ENCOUNTER — Encounter: Payer: Self-pay | Admitting: Allergy

## 2021-09-11 ENCOUNTER — Other Ambulatory Visit: Payer: Self-pay

## 2021-09-11 VITALS — BP 144/80 | HR 83 | Temp 97.7°F | Resp 16 | Ht 65.75 in | Wt 191.4 lb

## 2021-09-11 DIAGNOSIS — J3089 Other allergic rhinitis: Secondary | ICD-10-CM

## 2021-09-11 DIAGNOSIS — R0602 Shortness of breath: Secondary | ICD-10-CM | POA: Diagnosis not present

## 2021-09-11 DIAGNOSIS — J322 Chronic ethmoidal sinusitis: Secondary | ICD-10-CM | POA: Insufficient documentation

## 2021-09-11 MED ORDER — RYALTRIS 665-25 MCG/ACT NA SUSP: 1.0000 | Freq: Two times a day (BID) | NASAL | 5 refills | Status: DC

## 2021-09-11 MED ORDER — MONTELUKAST SODIUM 10 MG PO TABS: 10.0000 mg | ORAL_TABLET | Freq: Every day | ORAL | 5 refills | Status: DC

## 2021-09-11 NOTE — Assessment & Plan Note (Signed)
Perennial rhinitis symptoms worsening the last year.  Tried azelastine, Afrin and over-the-counter decongestant with some benefit.  No prior allergy/ENT evaluation.  Being worked up by neurology for sleep apnea.  Today's skin testing showed: Positive to grass, ragweed, weed, trees, mold, cockroach, borderline to dust mites. Negative to common foods.  Start environmental control measures as below.  Wean off sudafed and OTC decongestant pills.  Only use Afrin during severe nasal congestion episodes - okay to use a few times per month.   Start Singulair (montelukast) 10mg  daily at night.  Cautioned that in some children/adults can experience behavioral changes including hyperactivity, agitation, depression, sleep disturbances and suicidal ideations. These side effects are rare, but if you notice them you should notify me and discontinue Singulair (montelukast).  Start Ryaltris (olopatadine + mometasone nasal spray combination) 1-2 sprays per nostril twice a day. Sample given. o If too expensive let us know.  o Stop azelastine nasal spray.  Nasal saline spray (i.e., Simply Saline) or nasal saline lavage (i.e., NeilMed) is recommended as needed and prior to medicated nasal sprays.  Use over the counter antihistamines such as Zyrtec (cetirizine), Claritin (loratadine), Allegra (fexofenadine), or Xyzal (levocetirizine) daily as needed. May take twice a day during allergy flares. May switch antihistamines every few months.  Refer to ENT for chronic nasal congestion.   Consider allergy injections for long term control if above medications do not help the symptoms - handout given.

## 2021-09-11 NOTE — Telephone Encounter (Signed)
Called the pt and reviewed in detail what the sleep study showed. I reviewed Dr Dohmeier's recommendations made. Pt has already been working with Dr Maudie Mercury on allergies and patency. He had a visit today and wad ordered nasal sprays to try. They will see him in follow up visit in may. Advised the patient that we could see him in follow up visit after that apt to discuss if a repeat sleep study is needed or if trying cpap is next step. In meantime advised the patient to sleep on right side. Pt verbalized understanding.

## 2021-09-11 NOTE — Telephone Encounter (Signed)
-----   Message from Garnet Sierras, DO sent at 09/11/2021  5:24 PM EST ----- Please place referral to ENT for chronic nasal congestion, has environmental allergies. Thank you.

## 2021-09-11 NOTE — Telephone Encounter (Signed)
-----   Message from Larey Seat, MD sent at 09/11/2021  8:41 AM EST ----- In comparison to last sleep study,there has been a significant reduction in AHI, the patient lost weight, treats his allergies.  His apnea is overall mild now and most dependent on supine sleep , and secondary also on REM sleep.  This HST confirms the presence of mild obstructive sleep apnea with a strong REM component but an even stronger supine sleep dependency.  There was no significant hypoxia, heart rate variability was in normal range, snoring was present for a rather short time of total sleep time.  RECOMMENDATION: There is more than 1 approach to this mild sleep apnea.  #1 nasal patency seems to be a problem and treating that may already improve apnea and snoring significantly.  This patient may need allergy testing to identify the cause of his frequent rhinitis and sinusitis.    The patient should avoid sleeping in supine position ;this alone seems to reduce his overall AHI to 10 or less per hour. REM sleep dependent apnea usually calls for CPAP use, but nasal patency is important to tolerate CPAP at all. Once his nasal patency and sinusitis has been addressed it would be worthwhile retesting for sleep apnea.  In the meantime I just want him to stay off his back and will not start CPAP.  I would like for the patient to visit with me again after allergy testing may be in the next 2 to 4 months?

## 2021-09-11 NOTE — Progress Notes (Signed)
New Patient Note  RE: Earl Ruiz MRN: [id number] DOB: 09-25-1960 Date of Office Visit: 09/11/2021  Consult requested by: Larey Seat, MD Primary care provider: Darreld Mclean, MD  Chief Complaint: Allergy Testing (Poison ivy for sure that he knows that he is allergy to.), Sinusitis (Heat induced. He will stop up if the room is too hot. ), and Allergic Rhinitis  (Seasonal before COVID. The mask wearing had him all clogged up because he could not take it off during work hours.)  History of Present Illness: I had the pleasure of seeing Earl Ruiz for initial evaluation at the [facility] on 09/11/2021. He is a 61 y.o. male, who is referred here by Dr. Brett Fairy (neurology) for the evaluation of rhinitis.  He reports symptoms of nasal congestion, sneezing, rhinorrhea. Symptoms have been going on for many years but worse the past 1 year. The symptoms are present all year around with worsening in summer. Other triggers include exposure to wearing a mask. Anosmia: no. Headache: no. He has used azelastine and Afrin, zyrtec-D with fair improvement in symptoms. Sinus infections: no. Previous work up includes: none. Previous ENT evaluation: no. Previous sinus imaging: no. History of nasal polyps: no. History of reflux: no.  Patient evaluated by neurology for sleep apnea.  Assessment and Plan: Keylon is a 61 y.o. male with: Other allergic rhinitis Perennial rhinitis symptoms worsening the last year.  Tried azelastine, Afrin and over-the-counter decongestant with some benefit.  No prior allergy/ENT evaluation.  Being worked up by neurology for sleep apnea. Today's skin testing showed: Positive to grass, ragweed, weed, trees, mold, cockroach, borderline to dust mites. Negative to common foods. Start environmental control measures as below. Wean off sudafed and OTC decongestant pills. Only use Afrin during severe nasal congestion episodes - okay to use a few times per  month.  Start Singulair (montelukast) 10mg  daily at night. Cautioned that in some children/adults can experience behavioral changes including hyperactivity, agitation, depression, sleep disturbances and suicidal ideations. These side effects are rare, but if you notice them you should notify me and discontinue Singulair (montelukast). Start Ryaltris (olopatadine + mometasone nasal spray combination) 1-2 sprays per nostril twice a day. Sample given. If too expensive let us know.  Stop azelastine nasal spray. Nasal saline spray (i.e., Simply Saline) or nasal saline lavage (i.e., NeilMed) is recommended as needed and prior to medicated nasal sprays. Use over the counter antihistamines such as Zyrtec (cetirizine), Claritin (loratadine), Allegra (fexofenadine), or Xyzal (levocetirizine) daily as needed. May take twice a day during allergy flares. May switch antihistamines every few months. Refer to ENT for chronic nasal congestion.  Consider allergy injections for long term control if above medications do not help the symptoms - handout given.   Shortness of breath Uses albuterol 2-3 times per year.  Today's spirometry was normal. May use albuterol rescue inhaler 2 puffs every 4 to 6 hours as needed for shortness of breath, chest tightness, coughing, and wheezing. Monitor frequency of use.   Return in about 2 months (around 11/09/2021).  Meds ordered this encounter  Medications   montelukast (SINGULAIR) 10 MG tablet    Sig: Take 1 tablet (10 mg total) by mouth at bedtime.    Dispense:  30 tablet    Refill:  5   Olopatadine-Mometasone (RYALTRIS) 665-25 MCG/ACT SUSP    Sig: Place 1-2 sprays into the nose in the morning and at bedtime.    Dispense:  29 g    Refill:  5    [phone]   Lab Orders  No laboratory test(s) ordered today    Other allergy screening: Asthma: no Sometimes uses albuterol at night due to breathing issues.  Used it about 2-3 times last year.  Food allergy:  no Medication allergy: no Hymenoptera allergy: no Urticaria: no Eczema:no History of recurrent infections suggestive of immunodeficency: no  Diagnostics: Spirometry:  Tracings reviewed. His effort: Good reproducible efforts. FVC: 2.65L FEV1: 2.59L, 100% predicted FEV1/FVC ratio: 98% Interpretation: Spirometry consistent with normal pattern.  Please see scanned spirometry results for details.  Skin Testing: Environmental allergy panel and select foods. Grass  Results discussed with patient/family.  Airborne Adult Perc - 09/11/21 1400     Time Antigen Placed 1405    Allergen Manufacturer Lavella Hammock    Location Back    Number of Test 59    1. Control-Buffer 50% Glycerol Negative    2. Control-Histamine 1 mg/ml 2+    3. Albumin saline Negative    4. [place] 2+    5. [place] Negative    6. Johnson Negative    7. [place] Blue Negative    8. Meadow Fescue 3+    9. Perennial Rye Negative    10. Sweet Vernal Negative    11. Timothy Negative    12. Cocklebur Negative    13. Burweed Marshelder Negative    14. Ragweed, short Negative    15. Ragweed, Giant Negative    16. Plantain,  English Negative    17. Lamb's Quarters Negative    18. Sheep Sorrell Negative    19. Rough Pigweed Negative    20. Marsh Elder, Rough Negative    21. Mugwort, Common Negative    22. Ash mix Negative    23. Birch mix Negative    24. Beech American Negative    25. Box, Elder Negative    26. Cedar, red Negative    27. Cottonwood, [place] Negative    28. Elm mix Negative    29. Hickory Negative    30. Maple mix Negative    31. Oak, [place] mix Negative    32. Pecan Pollen Negative    33. Pine mix Negative    34. Sycamore Eastern Negative    35. Po[place]ott, Black Pollen Negative    36. Alternaria alternata Negative    37. Cladosporium Herbarum Negative    38. Aspergillus mix Negative    39. Penicillium mix Negative    40. Bipolaris sorokiniana (Helminthosporium) Negative    41. Drechslera spicifera  (Curvularia) Negative    42. Mucor plumbeus Negative    43. Fusarium moniliforme Negative    44. Aureobasidium pullulans (pullulara) Negative    45. Rhizopus oryzae Negative    46. Botrytis cinera Negative    47. Epicoccum nigrum Negative    48. Phoma betae Negative    49. Candida Albicans Negative    50. Trichophyton mentagrophytes Negative    51. Mite, D Farinae  5,000 AU/ml Negative    52. Mite, D Pteronyssinus  5,000 AU/ml Negative    53. Cat Hair 10,000 BAU/ml Negative    54.  Dog Epithelia Negative    55. Mixed Feathers Negative    56. Horse Epithelia Negative    57. Cockroach, German Negative    58. Mouse Negative    59. Tobacco Leaf Negative             Food Perc - 09/11/21 1400       Test Information   Time Antigen  Placed Morada Greer    Location Back    Number of allergen test 10      Food   1. Peanut Negative    2. Soybean food Negative    3. Wheat, whole Negative    4. Sesame Negative    5. Milk, cow Negative    6. Egg White, chicken Negative    7. Casein Negative    8. Shellfish mix Negative    9. Fish mix Negative    10. Cashew Negative             Intradermal - 09/11/21 1433     Time Antigen Placed 1438    Allergen Manufacturer Lavella Hammock    Location Arm    Number of Test 14    Control Negative    [place] 2+    Johnson 2+    Ragweed mix 2+    Weed mix 2+    Tree mix 3+    Mold 1 Negative    Mold 2 2+    Mold 3 Negative    Mold 4 Negative    Cat Negative    Dog Negative    Cockroach 3+    Mite mix --   +/-            Past Medical History: Patient Active Problem List   Diagnosis Date Noted   Sinusitis chronic, ethmoidal 09/11/2021   Shortness of breath 09/11/2021   Other allergic rhinitis 09/11/2021   Pre-diabetes 08/25/2015   Choking episode occurring at night 05/10/2014   Snoring 05/10/2014   Sleep apnea 05/10/2014   Past Medical History:  Diagnosis Date   Allergy    Choking episode occurring  at night 05/10/2014   Sleep apnea 05/10/2014   Snoring 05/10/2014   Past Surgical History: Past Surgical History:  Procedure Laterality Date   EXTRACORPOREAL SHOCK WAVE LITHOTRIPSY Left 03/28/2020   Procedure: EXTRACORPOREAL SHOCK WAVE LITHOTRIPSY (ESWL);  Surgeon: Festus Aloe, MD;  Location: [facility];  Service: Urology;  Laterality: Left;   Medication List:  Current Outpatient Medications  Medication Sig Dispense Refill   albuterol (VENTOLIN HFA) 108 (90 Base) MCG/ACT inhaler Inhale 2 puffs into the lungs every 6 (six) hours as needed for wheezing or shortness of breath. 18 g 5   cetirizine-pseudoephedrine (ZYRTEC-D) 5-120 MG tablet Take 1 tablet by mouth 2 (two) times daily. 30 tablet 5   meloxicam (MOBIC) 15 MG tablet TAKE 1 TABLET BY MOUTH ONCE DAILY AS NEEDED FOR PAIN 90 tablet 2   montelukast (SINGULAIR) 10 MG tablet Take 1 tablet (10 mg total) by mouth at bedtime. 30 tablet 5   Olopatadine-Mometasone (RYALTRIS) 665-25 MCG/ACT SUSP Place 1-2 sprays into the nose in the morning and at bedtime. 29 g 5   oxymetazoline (AFRIN) 0.05 % nasal spray Place 2 sprays into both nostrils 2 (two) times daily.     rosuvastatin (CRESTOR) 20 MG tablet Take 1 tablet (20 mg total) by mouth daily. 90 tablet 3   tamsulosin (FLOMAX) 0.4 MG CAPS capsule Take 1 capsule (0.4 mg total) by mouth daily. 90 capsule 3   No current facility-administered medications for this visit.   Allergies: No Known Allergies Social History: Social History   Socioeconomic History   Marital status: Unknown    Spouse name: Not on file   Number of children: Not on file   Years of education: Not on file   Highest education level: Not on file  Occupational  History   Not on file  Tobacco Use   Smoking status: Never    Passive exposure: Past   Smokeless tobacco: Never  Vaping Use   Vaping Use: Never used  Substance and Sexual Activity   Alcohol use: Yes    Comment: occ beer   Drug use: No    Sexual activity: Yes  Other Topics Concern   Not on file  Social History Narrative   Right handed.     Caffeine 2-3 cups daily    Married, no kids.  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    Social Determinants of Health   Financial Resource Strain: Not on file  Food Insecurity: Not on file  Transportation Needs: Not on file  Physical Activity: Not on file  Stress: Not on file  Social Connections: Not on file   Lives in a townhome. Smoking: denies Occupation: Advertising account executive outdoors.  Environmental History: Water Damage/mildew in the house: no Carpet in the family room: yes Carpet in the bedroom: yes Heating: electric Cooling: central Pet: no  Family History: Family History  Problem Relation Age of Onset   Diabetes Father    Allergic rhinitis Sister        as a child   Colon cancer Neg Hx    Review of Systems  Constitutional:  Negative for appetite change, chills, fever and unexpected weight change.  HENT:  Positive for congestion, rhinorrhea and sneezing.   Eyes:  Negative for itching.  Respiratory:  Negative for cough, chest tightness, shortness of breath and wheezing.   Cardiovascular:  Negative for chest pain.  Gastrointestinal:  Negative for abdominal pain.  Genitourinary:  Negative for difficulty urinating.  Skin:  Negative for rash.  Allergic/Immunologic: Positive for environmental allergies. Negative for food allergies.  Neurological:  Negative for headaches.   Objective: BP (!) 144/80    Pulse 83    Temp 97.7 F (36.5 C) (Temporal)    Resp 16    Ht 5' 5.75" (1.67 m)    Wt 191 lb 6.4 oz (86.8 kg)    SpO2 99%    BMI 31.13 kg/m  Body mass index is 31.13 kg/m. Physical Exam Vitals and nursing note reviewed.  Constitutional:      Appearance: Normal appearance. He is well-developed.  HENT:     Head: Normocephalic and atraumatic.     Right Ear: External ear normal. There is impacted cerumen.     Left Ear: External ear normal. There is impacted  cerumen.     Nose: Congestion present.     Mouth/Throat:     Mouth: Mucous membranes are moist.     Pharynx: Oropharynx is clear.     Comments: dentures Eyes:     Conjunctiva/sclera: Conjunctivae normal.  Cardiovascular:     Rate and Rhythm: Normal rate and regular rhythm.     Heart sounds: Normal heart sounds. No murmur heard.   No friction rub. No gallop.  Pulmonary:     Effort: Pulmonary effort is normal.     Breath sounds: Normal breath sounds. No wheezing, rhonchi or rales.  Musculoskeletal:     Cervical back: Neck supple.  Skin:    General: Skin is warm.     Findings: No rash.  Neurological:     Mental Status: He is alert and oriented to person, place, and time.  Psychiatric:        Behavior: Behavior normal.  The plan was reviewed with the patient/family, and all questions/concerned were  addressed.  It was my pleasure to see Nicholaos today and participate in his care. Please feel free to contact me with any questions or concerns.  Sincerely,  Rexene Alberts, DO Allergy & Immunology  [facility] office: [place] office: [phone]

## 2021-09-11 NOTE — Assessment & Plan Note (Signed)
Uses albuterol 2-3 times per year.   Today's spirometry was normal.  May use albuterol rescue inhaler 2 puffs every 4 to 6 hours as needed for shortness of breath, chest tightness, coughing, and wheezing. Monitor frequency of use.

## 2021-09-11 NOTE — Progress Notes (Signed)
In comparison to last sleep study,there has been a significant reduction in AHI, the patient lost weight, treats his allergies.  His apnea is overall mild now and most dependent on supine sleep , and secondary also on REM sleep.  This HST confirms the presence of mild obstructive sleep apnea with a strong REM component but an even stronger supine sleep dependency.  There was no significant hypoxia, heart rate variability was in normal range, snoring was present for a rather short time of total sleep time.  RECOMMENDATION: There is more than 1 approach to this mild sleep apnea.  #1 nasal patency seems to be a problem and treating that may already improve apnea and snoring significantly.  This patient may need allergy testing to identify the cause of his frequent rhinitis and sinusitis.    The patient should avoid sleeping in supine position ;this alone seems to reduce his overall AHI to 10 or less per hour. REM sleep dependent apnea usually calls for CPAP use, but nasal patency is important to tolerate CPAP at all. Once his nasal patency and sinusitis has been addressed it would be worthwhile retesting for sleep apnea.  In the meantime I just want him to stay off his back and will not start CPAP.  I would like for the patient to visit with me again after allergy testing may be in the next 2 to 4 months?

## 2021-09-11 NOTE — Patient Instructions (Addendum)
Today's skin testing showed: Positive to grass, ragweed, weed, trees, mold, cockroach, borderline to dust mites. Negative to common foods.  Results given.  Environmental allergies Start environmental control measures as below. Wean off the sudafed and the decongestant pills. Only use Afrin during severe nasal congestion episodes. Start Singulair (montelukast) 10mg  daily at night. Cautioned that in some children/adults can experience behavioral changes including hyperactivity, agitation, depression, sleep disturbances and suicidal ideations. These side effects are rare, but if you notice them you should notify me and discontinue Singulair (montelukast).  Start Ryaltris (olopatadine + mometasone nasal spray combination) 1-2 sprays per nostril twice a day. Sample given. If too expensive let us know.  Stop azelastine nasal spray. Nasal saline spray (i.e., Simply Saline) or nasal saline lavage (i.e., NeilMed) is recommended as needed and prior to medicated nasal sprays.  Use over the counter antihistamines such as Zyrtec (cetirizine), Claritin (loratadine), Allegra (fexofenadine), or Xyzal (levocetirizine) daily as needed. May take twice a day during allergy flares. May switch antihistamines every few months. Refer to ENT for chronic nasal congestion.  Consider allergy injections for long term control if above medications do not help the symptoms - handout given.   Breathing Normal breathing test today. May use albuterol rescue inhaler 2 puffs every 4 to 6 hours as needed for shortness of breath, chest tightness, coughing, and wheezing. Monitor frequency of use.   Follow up in 2 months or sooner if needed.    Reducing Pollen Exposure Pollen seasons: trees (spring), grass (summer) and ragweed/weeds (fall). Keep windows closed in your home and car to lower pollen exposure.  Install air conditioning in the bedroom and throughout the house if possible.  Avoid going out in dry windy days -  especially early morning. Pollen counts are highest between 5 - 10 AM and on dry, hot and windy days.  Save outside activities for late afternoon or after a heavy rain, when pollen levels are lower.  Avoid mowing of grass if you have grass pollen allergy. Be aware that pollen can also be transported indoors on people and pets.  Dry your clothes in an automatic dryer rather than hanging them outside where they might collect pollen.  Rinse hair and eyes before bedtime. Mold Control Mold and fungi can grow on a variety of surfaces provided certain temperature and moisture conditions exist.  Outdoor molds grow on plants, decaying vegetation and soil. The major outdoor mold, Alternaria and Cladosporium, are found in very high numbers during hot and dry conditions. Generally, a late summer - fall peak is seen for common outdoor fungal spores. Rain will temporarily lower outdoor mold spore count, but counts rise rapidly when the rainy period ends. The most important indoor molds are Aspergillus and Penicillium. Dark, humid and poorly ventilated basements are ideal sites for mold growth. The next most common sites of mold growth are the bathroom and the kitchen. Outdoor (Seasonal) Mold Control Use air conditioning and keep windows closed. Avoid exposure to decaying vegetation. Avoid leaf raking. Avoid grain handling. Consider wearing a face mask if working in moldy areas.  Indoor (Perennial) Mold Control  Maintain humidity below 50%. Get rid of mold growth on hard surfaces with water, detergent and, if necessary, 5% bleach (do not mix with other cleaners). Then dry the area completely. If mold covers an area more than 10 square feet, consider hiring an indoor environmental professional. For clothing, washing with soap and water is best. If moldy items cannot be cleaned and dried, throw them away.  Remove sources e.g. contaminated carpets. Repair and seal leaking roofs or pipes. Using dehumidifiers in  damp basements may be helpful, but empty the water and clean units regularly to prevent mildew from forming. All rooms, especially basements, bathrooms and kitchens, require ventilation and cleaning to deter mold and mildew growth. Avoid carpeting on concrete or damp floors, and storing items in damp areas. Control of House Dust Mite Allergen Dust mite allergens are a common trigger of allergy and asthma symptoms. While they can be found throughout the house, these microscopic creatures thrive in warm, humid environments such as bedding, upholstered furniture and carpeting. Because so much time is spent in the bedroom, it is essential to reduce mite levels there.  Encase pillows, mattresses, and box springs in special allergen-proof fabric covers or airtight, zippered plastic covers.  Bedding should be washed weekly in hot water (130 F) and dried in a hot dryer. Allergen-proof covers are available for comforters and pillows that cant be regularly washed.  Wash the allergy-proof covers every few months. Minimize clutter in the bedroom. Keep pets out of the bedroom.  Keep humidity less than 50% by using a dehumidifier or air conditioning. You can buy a humidity measuring device called a hygrometer to monitor this.  If possible, replace carpets with hardwood, linoleum, or washable area rugs. If that's not possible, vacuum frequently with a vacuum that has a HEPA filter. Remove all upholstered furniture and non-washable window drapes from the bedroom. Remove all non-washable stuffed toys from the bedroom.  Wash stuffed toys weekly.  Cockroach Allergen Avoidance Cockroaches are often found in the homes of densely populated urban areas, schools or commercial buildings, but these creatures can lurk almost anywhere. This does not mean that you have a dirty house or living area. Block all areas where roaches can enter the home. This includes crevices, wall cracks and windows.  Cockroaches need water to  survive, so fix and seal all leaky faucets and pipes. Have an exterminator go through the house when your family and pets are gone to eliminate any remaining roaches. Keep food in lidded containers and put pet food dishes away after your pets are done eating. Vacuum and sweep the floor after meals, and take out garbage and recyclables. Use lidded garbage containers in the kitchen. Wash dishes immediately after use and clean under stoves, refrigerators or toasters where crumbs can accumulate. Wipe off the stove and other kitchen surfaces and cupboards regularly.

## 2021-09-11 NOTE — Procedures (Signed)
[facility] TEST REPORT ( by [facility] PAT)   STUDY DATE:  08-30-2021   ORDERING CLINICIAN: Larey Seat, MD  REFERRING CLINICIAN: Dr Lorelei Pont,   CLINICAL INFORMATION/HISTORY: Earl Ruiz is a 61 year old  African American male patient who was seen here upon referral by PCP on 07/27/2021 in Consultation.   Chief concern according to patient :  last seen in 2015, now here for re-evaluation. He is a Games developer , working 12 hour shifts, commuting 1 hour back and forth.   For the last 3 years he has a lot more sinus congestion and nasal congestion, he now has nocturia , on flomax.    Earl Ruiz has a medical history of Allergy, Choking episode occurring at night (05/10/2014), Sleep apnea (05/10/2014), and Snoring (05/10/2014). Tonsils were large.     Epworth sleepiness score: 8/24.   BMI: 30.8 kg/m   Neck Circumference: 16"   FINDINGS:   Sleep Summary:   Total Recording Time (hours, min): Total recording time amounted to 7 hours 32 minutes with a total sleep time of 6 hours 25 minutes.  21.1% of this recording's sleep time were REM sleep.                            Respiratory Indices:   Calculated pAHI (per hour): 14.1                            REM pAHI: 28.3                                               NREM pAHI:    10.4                          Positional AHI:   Supine sleep was associated with an apnea hypopnea index of 46.1 while sleeping on the right side was only producing an AHI of 9.2/h.  Snoring reached a mean volume of 40 dB and was only present for 6.4% of sleep time.                                                Oxygen Saturation Statistics:          O2 Saturation Range (%): Between a nadir of 90 and a maximum of 99% saturation with a mean oxygen saturation of 95%.                                     O2 Saturation (minutes) <89%: 0 minutes          Pulse Rate Statistics:   Pulse Mean (bpm): 69 bpm                Pulse Range: Between 52 and 99 bpm               IMPRESSION:  This HST confirms the presence of mild obstructive sleep apnea with a strong REM component but an even stronger supine sleep  dependency.  There was no significant hypoxia, heart rate variability was in normal range, snoring was present for a rather short time of total sleep time.   RECOMMENDATION: There is more than 1 approach to this mild sleep apnea.  #1 nasal patency seems to be a problem and treating that may already improve apnea and snoring significantly.  This patient may need allergy testing to identify the cause of his frequent rhinitis and sinusitis.    The patient should avoid sleeping in supine position ;this alone seems to reduce his overall AHI to 10 or less per hour. REM sleep dependent apnea usually calls for CPAP use, but nasal patency is important to tolerate CPAP at all. Once his nasal patency and sinusitis has been addressed it would be worthwhile retesting for sleep apnea.  In the meantime I just want him to stay off his back and will not start CPAP.  I would like for the patient to visit with me again after allergy testing may be in the next 2 to 4 months?     INTERPRETING PHYSICIAN:   Larey Seat, MD   Medical Director of [facility].

## 2021-09-12 ENCOUNTER — Telehealth: Payer: Self-pay

## 2021-09-12 NOTE — Telephone Encounter (Signed)
Called patient - DOB verified - advised our office never received the form to complete to assist on Ryaltris being covered at a cheaper cost. He advised our office should be receiving the form in the next 2-3 days.   I explained to the patient/wife that once our office receives the form - it will be completed in 3-5 business days, then faxed back to the company for processing. Patient/Wife verbalized understanding, no further questions at this time.

## 2021-09-12 NOTE — Telephone Encounter (Signed)
Received PA for Ryaltris. PA has been submitted through CoverMyMeds for Ryaltris and is currently pending approval/denial.

## 2021-09-12 NOTE — Telephone Encounter (Signed)
Patient states he spoke with someone regarding the coverage for Ryaltris. He states we should be receiving a faxed form to be filled out for Ryaltris. This form will help with the medication being covered at a cheaper cost.

## 2021-09-13 NOTE — Telephone Encounter (Signed)
PA has been approved for Ryaltris. PA has been faxed to pharmacy, labeled, and placed in bulk scanning. Called and spoke with the patients wife and advised of approval, patients wife verbalized understanding.  ?

## 2021-10-12 ENCOUNTER — Other Ambulatory Visit: Payer: Self-pay | Admitting: Family Medicine

## 2021-10-12 DIAGNOSIS — N401 Enlarged prostate with lower urinary tract symptoms: Secondary | ICD-10-CM

## 2021-11-06 ENCOUNTER — Ambulatory Visit: Payer: BC Managed Care – PPO | Admitting: Family Medicine

## 2021-11-15 ENCOUNTER — Encounter: Payer: Self-pay | Admitting: Neurology

## 2021-11-15 ENCOUNTER — Ambulatory Visit: Payer: BC Managed Care – PPO | Admitting: Neurology

## 2021-11-15 VITALS — BP 157/91 | HR 73 | Ht 65.75 in | Wt 197.5 lb

## 2021-11-15 DIAGNOSIS — G4733 Obstructive sleep apnea (adult) (pediatric): Secondary | ICD-10-CM | POA: Diagnosis not present

## 2021-11-15 DIAGNOSIS — J322 Chronic ethmoidal sinusitis: Secondary | ICD-10-CM

## 2021-11-15 DIAGNOSIS — J3089 Other allergic rhinitis: Secondary | ICD-10-CM | POA: Diagnosis not present

## 2021-11-15 NOTE — Progress Notes (Signed)
? ? ?SLEEP MEDICINE CLINIC ?  ? ?Provider:  Larey Seat, MD  ?Primary Care Physician:  Earl Mclean, MD ?[address] ?[place] [address]  ? ?  ?Referring Provider: Darreld Mclean, Md ?[address] ?[address] ?[place],  [place] [address]  ?  ?  ?    ?Chief Complaint according to patient   ?Patient presents with:  ?  ? New Patient (Initial Visit)  ?     ?  ?  ?HISTORY OF PRESENT ILLNESS:  ?Earl Ruiz is a 61 y.o. African American male patient seen here in a RV post sleep study on 11/15/2021. ? ?His sleep study showed mild sleep apnea with a calculated AHI per hour of 14.1 but very strong REM sleep dependence REM AHI was 28.3/h, sleeping on his back was the worst case scenario with an AHI of 46.1/h if he slept on his right side he only had an AHI of 9.2/h.  Snoring was not a major factor in this home sleep test the mean volume was only 40 dB and it was only present for 6.4% of the total sleep time.  There were no oxygen desaturations of clinical significance.  Heart rate was in normal variability.  So my treatment suggestions were nasal patency and sinusitis to be addressed possible allergy testing and if that is established we should try to have the patient not sleep supine I would like them to retest and that is the case scenario now Earl Ruiz has been treated for his nasal congestion and sinusitis he has no longer taking Zyrtec but is on Singulair at bedtime.  And he uses Afrin nasal spray 2 times daily he has now only used it once or twice a week or less even from the previous overuse of Afrin up to 5 times a day.  All over he is breathing easier and his Epworth Sleepiness Scale was now endorsed at 8 points fatigue severity scale was not endorsed geriatric depression score for this gentleman was 0 out of 15 points. ? ?My goal is to retest him now for sleep apnea. If his AHI is lower than 10/h, we will forgo any other interventions.  ? ? ? ? ? ? ? ? Consultation on 07-27-2021.    ?Chief concern according to patient :  last seen in 2015, now here for re-evaluation. ?He is a Games developer , working 12 hour shifts, commuting 1 hour back and forth.   ?For the last 3 years he has a lot more sinus congestion and nasal congestion, he now has nocturia , on flomax.  ?  ? Earl Ruiz  has a past medical history of Allergy, Choking episode occurring at night (05/10/2014), Sleep apnea (05/10/2014), and Snoring (05/10/2014). Tonsils were large.  ?  ?The patient had the first sleep study in the year 2015. The result were documented in EPIC.  ?Mrs. Earl Ruiz has witnessed ongoing apneas his SPECT study polysomnography from 12-2 2015 at [facility] sleep showed a baseline AHI of 47.2 no REM sleep and in supine his apnea index was 61.9 which equals 1 apnea a minute of sleep when on his back.  Limb movements were also noted but very very mild there was no abnormality in heart rate, CPAP was initiated at 5 and titrated to 9 cmH2O then the AHI was reduced to 2.1/h.  Sleep efficiency was 93% on CPAP.  The patient began having a few more periodic limb movements on the CPAP  and he reports that he did not tolerated that well.  We ordered an CPAP at 11 cmH2O present is some mask and medium size heated humidity but there were insurance coverage issues and she never received the CPAP for home use.  His  body mass index  was higher, his weight was over 207 pounds when I saw him in 2015.  So he had actually little bit lower at 190 pounds now.  ?  ?Sleep relevant medical history: Nocturia/ 1-3,    ? Family medical /sleep history: No other family member on CPAP with OSA, insomnia, sleep walkers.  ?  ?Social history:  Patient is working as a Games developer, living with spouse, no children.  No pets,  ?The patient currently works/ used to work in shifts, 5.45 AM to 18.15 .  ? ?Tobacco use: none .  ETOH use ; 1 beer a day-,  ?Caffeine intake in form of Coffee( 2 cups a day) Soda( /) Tea ( /) or energy drinks. ?Regular  exercise in form of working, steps 7-9 k a day.   ? ?See 2015 VISIT :HPI:  Earl Ruiz is a 61 y.o. male , who is seen here as a referral from Dr. Lorelei Ruiz for a sleep consultation, ? ?Mr. Earl Ruiz reports that he is snoring at night and his wife has witnessed this, and that he feels sometimes short of breath while sleeping he describes his sleep choking sensation. His wife also has witnessed him gasping for breath.  ?About 2 years ago he changed a lot of his diet, he was successful in losing weight and controlling his cholesterolemia and he  regularly works out.  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 ?He reports neither sleepiness, nor fatigue. He goes to bed at 10.30  PM and sleeps soon after, sleeps through the night,  unless he has choking or louder snoring episodes. He has a one time nocturia, sometimes 2. His wife reports he snores on his back and on his side. Not every night. He rises 4 days a week before 7 AM, he wakes with an alarm.  ?On weekends 3 AM , as he works another second job on weekends. He rarely dreams.  ? ?The bedroom is quiet and dark, and cool. He has allergic seasonal rhinitis. ?He has no known neck injuries, facial injuries or ENT surgery.  ? ?  ?  ?Sleep habits are as follows: The patient's dinner time is between 5-9 PM. The patient goes to bed at 10.30 PM and promptly falls asleep-continues to sleep for 4-7 hours, wakes for 1-2 bathroom breaks, the first time at 2 AM.  Sometimes reports GERD.  ?The preferred sleep position is variable, with the support of 1 pillow.  ?Dreams are reportedly rare.  ?5-7  AM is the usual rise time. The patient wakes up with an alarm.  ?He reports  feeling refreshed or restored in AM, with symptoms such as dry mouth, rare morning headaches when sinus congestion, and residual fatigue.  ?Naps are taken frequently when he has an opportunity-, lasting from 1 to 2 hours. ? ?  ?Review of Systems: ?Out of a complete 14  system review, the patient complains of only the following symptoms, and all other reviewed systems are negative.:  ?Fatigue, sleepiness , snoring, choking,  ? ?Snoring louder than in 2015.  ?  ?How likely are you to doze in the following situations: ?0 = not likely, 1 = slight chance, 2 =  moderate chance, 3 = high chance ?  ?Sitting and Reading? ?Watching Television? ?Sitting inactive in a public place (theater or meeting)? ?As a passenger in a car for an hour without a break? ?Lying down in the afternoon when circumstances permit? ?Sitting and talking to someone? ?Sitting quietly after lunch without alcohol? ?In a car, while stopped for a few minutes in traffic? ?  ?Total = 8/ 24 points  ? FSS endorsed at 20/ 63 points.  ? ? ? ? ?Social History  ? ?Socioeconomic History  ? Marital status: Married  ?  Spouse name: Nevin Bloodgood   ? Number of children: No biological children, but he raised 1 niece and 2 nephews as his own.   ? Years of education: Not on file  ? Highest education level: Not on file  ?Occupational History  ? Not on file  ?Tobacco Use  ? Smoking status: Never  ?  Passive exposure: Past  ? Smokeless tobacco: Never  ?Vaping Use  ? Vaping Use: Never used  ?Substance and Sexual Activity  ? Alcohol use: Yes  ?  Comment: occ beer  ? Drug use: No  ? Sexual activity: Yes  ?Other Topics Concern  ? Not on file  ?Social History Narrative  ? Right handed.    ? Caffeine 2-3 cups daily  ?  Married, no kids.  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   ? ?Social Determinants of Health  ? ?Financial Resource Strain: Not on file  ?Food Insecurity: Not on file  ?Transportation Needs: Not on file  ?Physical Activity: Not on file  ?Stress: Not on file  ?Social Connections: Not on file  ? ? ?Family History  ?Problem Relation Age of Onset  ? Diabetes Father   ? Allergic rhinitis Sister   ?     as a child  ? Colon cancer Neg Hx   ? ? ?Past Medical History:  ?Diagnosis Date  ? Allergies   ? Choking episode occurring at night 05/10/2014  ? Sleep apnea  05/10/2014  ? Snoring ? ?Sinusitis, rhinitis, allergic.  05/10/2014  ? ? ?Past Surgical History:  ?Procedure Laterality Date  ? EXTRACORPOREAL SHOCK WAVE LITHOTRIPSY Left 03/28/2020  ? Procedure: EXTRACORPOREAL SHOCK

## 2021-11-28 ENCOUNTER — Telehealth: Payer: Self-pay | Admitting: Neurology

## 2021-11-28 DIAGNOSIS — J322 Chronic ethmoidal sinusitis: Secondary | ICD-10-CM

## 2021-11-28 DIAGNOSIS — R0989 Other specified symptoms and signs involving the circulatory and respiratory systems: Secondary | ICD-10-CM

## 2021-11-28 DIAGNOSIS — R0683 Snoring: Secondary | ICD-10-CM

## 2021-11-28 DIAGNOSIS — G4733 Obstructive sleep apnea (adult) (pediatric): Secondary | ICD-10-CM

## 2021-11-28 NOTE — Telephone Encounter (Signed)
Order has been placed for the patient. 

## 2021-11-28 NOTE — Telephone Encounter (Signed)
I called the patient insurance [employer] and spoke with [facility] she informed me that the HST is a uncovered code but a in lab sleep study is a covered code but it requires an authorization. Please put a in lab sleep study order in thank you and I will start the process for the approval.  ?

## 2021-12-04 ENCOUNTER — Ambulatory Visit: Payer: BC Managed Care – PPO | Admitting: Allergy

## 2021-12-04 DIAGNOSIS — J309 Allergic rhinitis, unspecified: Secondary | ICD-10-CM

## 2021-12-04 NOTE — Progress Notes (Deleted)
Follow Up Note  RE: Earl Ruiz MRN: [id number] DOB: [birth year] Date of Office Visit: 12/04/2021  Referring provider: Darreld Mclean, MD Primary care provider: Darreld Mclean, MD  Chief Complaint: No chief complaint on file.  History of Present Illness: I had the pleasure of seeing Earl Ruiz for a follow up visit at the [facility] [place] [facility] [place] on 12/04/2021. He is a 61 y.o. male, who is being followed for allergic rhinitis and shortness of breath. His previous allergy office visit was on 09/11/2021 with Dr. Maudie Mercury. Today is a regular follow up visit.  Other allergic rhinitis Perennial rhinitis symptoms worsening the last year.  Tried azelastine, Afrin and over-the-counter decongestant with some benefit.  No prior allergy/ENT evaluation.  Being worked up by neurology for sleep apnea. Today's skin testing showed: Positive to grass, ragweed, weed, trees, mold, cockroach, borderline to dust mites. Negative to common foods. Start environmental control measures as below. Wean off sudafed and OTC decongestant pills. Only use Afrin during severe nasal congestion episodes - okay to use a few times per month.  Start Singulair (montelukast) '10mg'$  daily at night. Cautioned that in some children/adults can experience behavioral changes including hyperactivity, agitation, depression, sleep disturbances and suicidal ideations. These side effects are rare, but if you notice them you should notify me and discontinue Singulair (montelukast). Start Ryaltris (olopatadine + mometasone nasal spray combination) 1-2 sprays per nostril twice a day. Sample given. If too expensive let us know.  Stop azelastine nasal spray. Nasal saline spray (i.e., Simply Saline) or nasal saline lavage (i.e., NeilMed) is recommended as needed and prior to medicated nasal sprays. Use over the counter antihistamines such as Zyrtec (cetirizine), Claritin (loratadine), Allegra (fexofenadine), or Xyzal (levocetirizine)  daily as needed. May take twice a day during allergy flares. May switch antihistamines every few months. Refer to ENT for chronic nasal congestion.  Consider allergy injections for long term control if above medications do not help the symptoms - handout given.    Shortness of breath Uses albuterol 2-3 times per year.  Today's spirometry was normal. May use albuterol rescue inhaler 2 puffs every 4 to 6 hours as needed for shortness of breath, chest tightness, coughing, and wheezing. Monitor frequency of use.    Return in about 2 months (around 11/09/2021).  Assessment and Plan: Earl Ruiz is a 61 y.o. male with: No problem-specific Assessment & Plan notes found for this encounter.  No follow-ups on file.  No orders of the defined types were placed in this encounter.  Lab Orders  No laboratory test(s) ordered today    Diagnostics: Spirometry:  Tracings reviewed. His effort: {Blank single:19197::"Good reproducible efforts.","It was hard to get consistent efforts and there is a question as to whether this reflects a maximal maneuver.","Poor effort, data can not be interpreted."} FVC: ***L FEV1: ***L, ***% predicted FEV1/FVC ratio: ***% Interpretation: {Blank single:19197::"Spirometry consistent with mild obstructive disease","Spirometry consistent with moderate obstructive disease","Spirometry consistent with severe obstructive disease","Spirometry consistent with possible restrictive disease","Spirometry consistent with mixed obstructive and restrictive disease","Spirometry uninterpretable due to technique","Spirometry consistent with normal pattern","No overt abnormalities noted given today's efforts"}.  Please see scanned spirometry results for details.  Skin Testing: {Blank single:19197::"Select foods","Environmental allergy panel","Environmental allergy panel and select foods","Food allergy panel","None","Deferred due to recent antihistamines use"}. *** Results discussed with  patient/family.   Medication List:  Current Outpatient Medications  Medication Sig Dispense Refill   albuterol (VENTOLIN HFA) 108 (90 Base) MCG/ACT inhaler Inhale 2 puffs into the lungs every 6 (six)  hours as needed for wheezing or shortness of breath. 18 g 5   meloxicam (MOBIC) 15 MG tablet TAKE 1 TABLET BY MOUTH ONCE DAILY AS NEEDED FOR PAIN 90 tablet 2   montelukast (SINGULAIR) 10 MG tablet Take 1 tablet (10 mg total) by mouth at bedtime. 30 tablet 5   Olopatadine-Mometasone (RYALTRIS) 665-25 MCG/ACT SUSP Place 1-2 sprays into the nose in the morning and at bedtime. 29 g 5   rosuvastatin (CRESTOR) 20 MG tablet Take 1 tablet (20 mg total) by mouth daily. 90 tablet 3   tamsulosin (FLOMAX) 0.4 MG CAPS capsule TAKE 1 CAPSULE BY MOUTH EVERY DAY 90 capsule 1   No current facility-administered medications for this visit.   Allergies: Allergies  Allergen Reactions   Bee Pollen Hives   I reviewed his past medical history, social history, family history, and environmental history and no significant changes have been reported from his previous visit.  Review of Systems  Constitutional:  Negative for appetite change, chills, fever and unexpected weight change.  HENT:  Positive for congestion, rhinorrhea and sneezing.   Eyes:  Negative for itching.  Respiratory:  Negative for cough, chest tightness, shortness of breath and wheezing.   Cardiovascular:  Negative for chest pain.  Gastrointestinal:  Negative for abdominal pain.  Genitourinary:  Negative for difficulty urinating.  Skin:  Negative for rash.  Allergic/Immunologic: Positive for environmental allergies. Negative for food allergies.  Neurological:  Negative for headaches.   Objective: There were no vitals taken for this visit. There is no height or weight on file to calculate BMI. Physical Exam Vitals and nursing note reviewed.  Constitutional:      Appearance: Normal appearance. He is well-developed.  HENT:     Head:  Normocephalic and atraumatic.     Right Ear: External ear normal. There is impacted cerumen.     Left Ear: External ear normal. There is impacted cerumen.     Nose: Congestion present.     Mouth/Throat:     Mouth: Mucous membranes are moist.     Pharynx: Oropharynx is clear.     Comments: dentures Eyes:     Conjunctiva/sclera: Conjunctivae normal.  Cardiovascular:     Rate and Rhythm: Normal rate and regular rhythm.     Heart sounds: Normal heart sounds. No murmur heard.   No friction rub. No gallop.  Pulmonary:     Effort: Pulmonary effort is normal.     Breath sounds: Normal breath sounds. No wheezing, rhonchi or rales.  Musculoskeletal:     Cervical back: Neck supple.  Skin:    General: Skin is warm.     Findings: No rash.  Neurological:     Mental Status: He is alert and oriented to person, place, and time.  Psychiatric:        Behavior: Behavior normal.   Previous notes and tests were reviewed. The plan was reviewed with the patient/family, and all questions/concerned were addressed.  It was my pleasure to see Earl Ruiz today and participate in his care. Please feel free to contact me with any questions or concerns.  Sincerely,  Rexene Alberts, DO Allergy & Immunology  [facility] office: [place] office: [phone]

## 2021-12-05 ENCOUNTER — Ambulatory Visit: Payer: BC Managed Care – PPO | Admitting: Neurology

## 2021-12-12 NOTE — Telephone Encounter (Signed)
BCBS Josem KaufmannDelice Ruiz: [phone] (exp. 11/28/21 to 11/29/22), spoke with patient wife who is on the Earl Ruiz about scheduling the appointment. She states she will call back to schedule.

## 2022-01-08 IMAGING — DX DG ABDOMEN 1V
1 series · 1 of 1 positions shown · non-contrast
Comparison: CT abdomen and pelvis March 10, 2020

CLINICAL DATA: Left-sided nephrolithiasis

EXAM:
X-RAY ABDOMEN 1 VIEW

[abdomen kub]
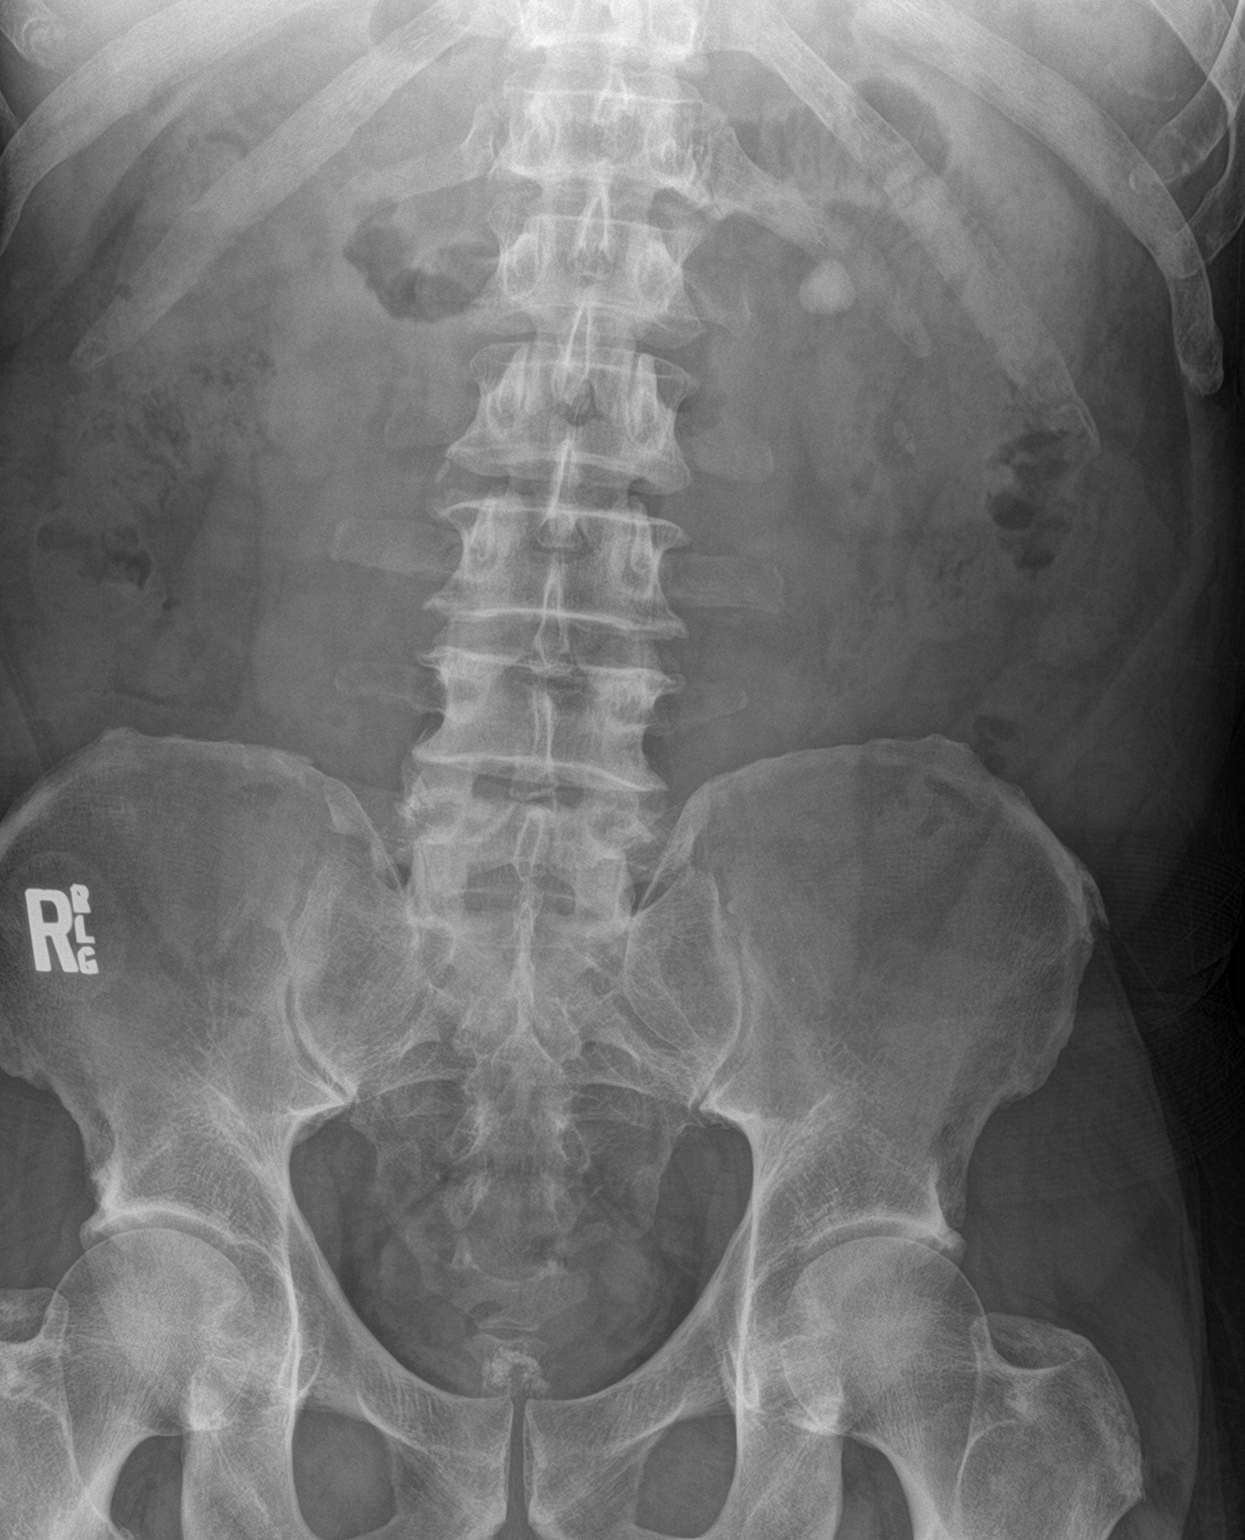

[1 of 1 positions shown; findings below may reference images not displayed]

FINDINGS: There is a calculus on the left, lateral to the T12 level measuring
1.4 x 1.4 cm, likely in the left renal pelvis, stable from prior CT.
There are calculi in the lower pole left kidney, stable, measuring
between 3 and 5 mm. There are multiple prostatic calculi. No other
abnormal calcifications are evident.

There is moderate stool in the colon. No bowel dilatation or
air-fluid level to suggest bowel obstruction. No free air.
IMPRESSION: 1.4 x 1.4 cm apparent calculus in the expected region of the left
renal pelvis. Smaller calculi lower pole left kidney. Multiple
prostatic calculi.

No bowel obstruction or free air.

## 2022-01-30 DIAGNOSIS — H00015 Hordeolum externum left lower eyelid: Secondary | ICD-10-CM | POA: Diagnosis not present

## 2022-02-04 NOTE — Progress Notes (Deleted)
Follow Up Note  RE: Earl Ruiz MRN: [id number] DOB: [birth year] Date of Office Visit: 02/05/2022  Referring provider: Darreld Mclean, MD Primary care provider: Darreld Mclean, MD  Chief Complaint: No chief complaint on file.  History of Present Illness: I had the pleasure of seeing Earl Ruiz for a follow up visit at the [facility] [place] [facility] [place] on 02/04/2022. He is a 61 y.o. male, who is being followed for allergic rhinitis and shortness of breath. His previous allergy office visit was on 09/11/2021 with Dr. Maudie Mercury. Today is a regular follow up visit.  Other allergic rhinitis Perennial rhinitis symptoms worsening the last year.  Tried azelastine, Afrin and over-the-counter decongestant with some benefit.  No prior allergy/ENT evaluation.  Being worked up by neurology for sleep apnea. Today's skin testing showed: Positive to grass, ragweed, weed, trees, mold, cockroach, borderline to dust mites. Negative to common foods. Start environmental control measures as below. Wean off sudafed and OTC decongestant pills. Only use Afrin during severe nasal congestion episodes - okay to use a few times per month.  Start Singulair (montelukast) '10mg'$  daily at night. Cautioned that in some children/adults can experience behavioral changes including hyperactivity, agitation, depression, sleep disturbances and suicidal ideations. These side effects are rare, but if you notice them you should notify me and discontinue Singulair (montelukast). Start Ryaltris (olopatadine + mometasone nasal spray combination) 1-2 sprays per nostril twice a day. Sample given. If too expensive let us know.  Stop azelastine nasal spray. Nasal saline spray (i.e., Simply Saline) or nasal saline lavage (i.e., NeilMed) is recommended as needed and prior to medicated nasal sprays. Use over the counter antihistamines such as Zyrtec (cetirizine), Claritin (loratadine), Allegra (fexofenadine), or Xyzal (levocetirizine)  daily as needed. May take twice a day during allergy flares. May switch antihistamines every few months. Refer to ENT for chronic nasal congestion.  Consider allergy injections for long term control if above medications do not help the symptoms - handout given.    Shortness of breath Uses albuterol 2-3 times per year.  Today's spirometry was normal. May use albuterol rescue inhaler 2 puffs every 4 to 6 hours as needed for shortness of breath, chest tightness, coughing, and wheezing. Monitor frequency of use.    Return in about 2 months (around 11/09/2021).  Assessment and Plan: [name] is a 60 y.o. male with: No problem-specific Assessment & Plan notes found for this encounter.  No follow-ups on file.  No orders of the defined types were placed in this encounter.  Lab Orders  No laboratory test(s) ordered today    Diagnostics: Spirometry:  Tracings reviewed. His effort: {Blank single:19197::"Good reproducible efforts.","It was hard to get consistent efforts and there is a question as to whether this reflects a maximal maneuver.","Poor effort, data can not be interpreted."} FVC: ***L FEV1: ***L, ***% predicted FEV1/FVC ratio: ***% Interpretation: {Blank single:19197::"Spirometry consistent with mild obstructive disease","Spirometry consistent with moderate obstructive disease","Spirometry consistent with severe obstructive disease","Spirometry consistent with possible restrictive disease","Spirometry consistent with mixed obstructive and restrictive disease","Spirometry uninterpretable due to technique","Spirometry consistent with normal pattern","No overt abnormalities noted given today's efforts"}.  Please see scanned spirometry results for details.  Skin Testing: {Blank single:19197::"Select foods","Environmental allergy panel","Environmental allergy panel and select foods","Food allergy panel","None","Deferred due to recent antihistamines use"}. *** Results discussed with  patient/family.   Medication List:  Current Outpatient Medications  Medication Sig Dispense Refill   albuterol (VENTOLIN HFA) 108 (90 Base) MCG/ACT inhaler Inhale 2 puffs into the lungs every 6 (six)  hours as needed for wheezing or shortness of breath. 18 g 5   meloxicam (MOBIC) 15 MG tablet TAKE 1 TABLET BY MOUTH ONCE DAILY AS NEEDED FOR PAIN 90 tablet 2   montelukast (SINGULAIR) 10 MG tablet Take 1 tablet (10 mg total) by mouth at bedtime. 30 tablet 5   Olopatadine-Mometasone (RYALTRIS) 665-25 MCG/ACT SUSP Place 1-2 sprays into the nose in the morning and at bedtime. 29 g 5   rosuvastatin (CRESTOR) 20 MG tablet Take 1 tablet (20 mg total) by mouth daily. 90 tablet 3   tamsulosin (FLOMAX) 0.4 MG CAPS capsule TAKE 1 CAPSULE BY MOUTH EVERY DAY 90 capsule 1   No current facility-administered medications for this visit.   Allergies: Allergies  Allergen Reactions   Bee Pollen Hives   I reviewed his past medical history, social history, family history, and environmental history and no significant changes have been reported from his previous visit.  Review of Systems  Constitutional:  Negative for appetite change, chills, fever and unexpected weight change.  HENT:  Positive for congestion, rhinorrhea and sneezing.   Eyes:  Negative for itching.  Respiratory:  Negative for cough, chest tightness, shortness of breath and wheezing.   Cardiovascular:  Negative for chest pain.  Gastrointestinal:  Negative for abdominal pain.  Genitourinary:  Negative for difficulty urinating.  Skin:  Negative for rash.  Allergic/Immunologic: Positive for environmental allergies. Negative for food allergies.  Neurological:  Negative for headaches.    Objective: There were no vitals taken for this visit. There is no height or weight on file to calculate BMI. Physical Exam Vitals and nursing note reviewed.  Constitutional:      Appearance: Normal appearance. He is well-developed.  HENT:     Head:  Normocephalic and atraumatic.     Right Ear: External ear normal. There is impacted cerumen.     Left Ear: External ear normal. There is impacted cerumen.     Nose: Congestion present.     Mouth/Throat:     Mouth: Mucous membranes are moist.     Pharynx: Oropharynx is clear.     Comments: dentures Eyes:     Conjunctiva/sclera: Conjunctivae normal.  Cardiovascular:     Rate and Rhythm: Normal rate and regular rhythm.     Heart sounds: Normal heart sounds. No murmur heard.    No friction rub. No gallop.  Pulmonary:     Effort: Pulmonary effort is normal.     Breath sounds: Normal breath sounds. No wheezing, rhonchi or rales.  Musculoskeletal:     Cervical back: Neck supple.  Skin:    General: Skin is warm.     Findings: No rash.  Neurological:     Mental Status: He is alert and oriented to person, place, and time.  Psychiatric:        Behavior: Behavior normal.    Previous notes and tests were reviewed. The plan was reviewed with the patient/family, and all questions/concerned were addressed.  It was my pleasure to see Delawrence today and participate in his care. Please feel free to contact me with any questions or concerns.  Sincerely,  Rexene Alberts, DO Allergy & Immunology  [facility] office: [place] office: [phone]

## 2022-02-05 ENCOUNTER — Ambulatory Visit: Payer: BC Managed Care – PPO | Admitting: Allergy

## 2022-02-05 DIAGNOSIS — J309 Allergic rhinitis, unspecified: Secondary | ICD-10-CM

## 2022-02-05 DIAGNOSIS — J302 Other seasonal allergic rhinitis: Secondary | ICD-10-CM

## 2022-02-05 DIAGNOSIS — R0602 Shortness of breath: Secondary | ICD-10-CM

## 2022-02-23 ENCOUNTER — Other Ambulatory Visit: Payer: Self-pay | Admitting: Family Medicine

## 2022-02-23 DIAGNOSIS — R350 Frequency of micturition: Secondary | ICD-10-CM

## 2022-02-26 NOTE — Telephone Encounter (Signed)
Pt is calling regarding refill.   Medication: tamsulosin (FLOMAX) 0.4 MG CAPS capsule   Has the patient contacted their pharmacy? Yes.     Preferred Pharmacy  [id number]  [address] [id number] Phone:  [phone] Fax:  [phone]

## 2022-03-15 ENCOUNTER — Other Ambulatory Visit: Payer: Self-pay | Admitting: *Deleted

## 2022-03-21 ENCOUNTER — Other Ambulatory Visit: Payer: Self-pay | Admitting: *Deleted

## 2022-03-21 MED ORDER — RYALTRIS 665-25 MCG/ACT NA SUSP: 1.0000 | Freq: Two times a day (BID) | NASAL | 5 refills | Status: DC

## 2022-06-12 ENCOUNTER — Other Ambulatory Visit: Payer: Self-pay | Admitting: Allergy

## 2022-07-15 ENCOUNTER — Other Ambulatory Visit: Payer: Self-pay | Admitting: Allergy

## 2022-07-29 NOTE — Patient Instructions (Signed)
It was great to see again today, I will be in touch with your labs as soon as possible  Consider getting the latest COVID-vaccine and a dose of RSV at your pharmacy  Flu shot today Colon due at the end of this year Stop by imaging on the ground floor and set up your coronary calcium test

## 2022-07-29 NOTE — Progress Notes (Unsigned)
[place] [address] [place], [place] [address] [phone]  Date:  07/30/2022   Name:  Earl Ruiz   DOB:  July 05, 1961   MRN:  [id number]  PCP:  Darreld Mclean, MD    Chief Complaint: No chief complaint on file.   History of Present Illness:  Earl Ruiz is a 62 y.o. very pleasant male patient who presents with the following:  Patient seen today for physical exam Most recent visit with myself October 2022 History of prediabetes, sleep apnea, dyslipidemia  Flu vaccine Recommend COVID booster Colonoscopy due at the end of this year Shingrix is complete Blood work needs to be updated today Can offer CT coronary calcium  Patient Active Problem List   Diagnosis Date Noted   Mild obstructive sleep apnea-hypopnea syndrome 11/15/2021   Sinusitis chronic, ethmoidal 09/11/2021   Shortness of breath 09/11/2021   Other allergic rhinitis 09/11/2021   Pre-diabetes 08/25/2015   Choking episode occurring at night 05/10/2014   Snoring 05/10/2014   Sleep apnea 05/10/2014    Past Medical History:  Diagnosis Date   Allergy    Choking episode occurring at night 05/10/2014   Sleep apnea 05/10/2014   Snoring 05/10/2014    Past Surgical History:  Procedure Laterality Date   EXTRACORPOREAL SHOCK WAVE LITHOTRIPSY Left 03/28/2020   Procedure: EXTRACORPOREAL SHOCK WAVE LITHOTRIPSY (ESWL);  Surgeon: Festus Aloe, MD;  Location: [facility];  Service: Urology;  Laterality: Left;    Social History   Tobacco Use   Smoking status: Never    Passive exposure: Past   Smokeless tobacco: Never  Vaping Use   Vaping Use: Never used  Substance Use Topics   Alcohol use: Yes    Comment: occ beer   Drug use: No    Family History  Problem Relation Age of Onset   Diabetes Father    Allergic rhinitis Sister        as a child   Colon cancer Neg Hx     Allergies  Allergen Reactions   Bee Pollen  Hives    Medication list has been reviewed and updated.  Current Outpatient Medications on File Prior to Visit  Medication Sig Dispense Refill   albuterol (VENTOLIN HFA) 108 (90 Base) MCG/ACT inhaler Inhale 2 puffs into the lungs every 6 (six) hours as needed for wheezing or shortness of breath. 18 g 5   meloxicam (MOBIC) 15 MG tablet TAKE 1 TABLET BY MOUTH ONCE DAILY AS NEEDED FOR PAIN 90 tablet 2   montelukast (SINGULAIR) 10 MG tablet TAKE 1 TABLET BY MOUTH AT BEDTIME 30 tablet 0   rosuvastatin (CRESTOR) 20 MG tablet Take 1 tablet (20 mg total) by mouth daily. 90 tablet 3   RYALTRIS 665-25 MCG/ACT SUSP Place 1-2 sprays into the nose in the morning and at bedtime. 29 g 5   tamsulosin (FLOMAX) 0.4 MG CAPS capsule TAKE 1 CAPSULE BY MOUTH EVERY DAY 90 capsule 1   No current facility-administered medications on file prior to visit.    Review of Systems:  As per HPI- otherwise negative.   Physical Examination: There were no vitals filed for this visit. There were no vitals filed for this visit. There is no height or weight on file to calculate BMI. Ideal Body Weight:    GEN: no acute distress. HEENT: Atraumatic, Normocephalic.  Ears and Nose: No external deformity. CV: RRR, No M/G/R. No JVD. No thrill. No extra  heart sounds. PULM: CTA B, no wheezes, crackles, rhonchi. No retractions. No resp. distress. No accessory muscle use. ABD: S, NT, ND, +BS. No rebound. No HSM. EXTR: No c/c/e PSYCH: Normally interactive. Conversant.    Assessment and Plan: *** Physical exam today.  Encouraged healthy diet and exercise routine Will plan further follow- up pending labs.  Signed Lamar Blinks, MD

## 2022-07-30 ENCOUNTER — Ambulatory Visit (INDEPENDENT_AMBULATORY_CARE_PROVIDER_SITE_OTHER): Payer: BC Managed Care – PPO | Admitting: Family Medicine

## 2022-07-30 VITALS — BP 132/84 | HR 74 | Temp 97.6°F | Resp 18 | Ht 67.0 in | Wt 198.6 lb

## 2022-07-30 DIAGNOSIS — Z130 Encounter for screening for diseases of the blood and blood-forming organs and certain disorders involving the immune mechanism: Secondary | ICD-10-CM

## 2022-07-30 DIAGNOSIS — N401 Enlarged prostate with lower urinary tract symptoms: Secondary | ICD-10-CM

## 2022-07-30 DIAGNOSIS — Z23 Encounter for immunization: Secondary | ICD-10-CM | POA: Diagnosis not present

## 2022-07-30 DIAGNOSIS — Z125 Encounter for screening for malignant neoplasm of prostate: Secondary | ICD-10-CM | POA: Diagnosis not present

## 2022-07-30 DIAGNOSIS — R7303 Prediabetes: Secondary | ICD-10-CM

## 2022-07-30 DIAGNOSIS — E785 Hyperlipidemia, unspecified: Secondary | ICD-10-CM | POA: Diagnosis not present

## 2022-07-30 DIAGNOSIS — J3089 Other allergic rhinitis: Secondary | ICD-10-CM

## 2022-07-30 DIAGNOSIS — R350 Frequency of micturition: Secondary | ICD-10-CM

## 2022-07-30 DIAGNOSIS — Z0000 Encounter for general adult medical examination without abnormal findings: Secondary | ICD-10-CM | POA: Diagnosis not present

## 2022-07-30 MED ORDER — TAMSULOSIN HCL 0.4 MG PO CAPS: 0.4000 mg | ORAL_CAPSULE | Freq: Every day | ORAL | 3 refills | Status: DC

## 2022-07-30 MED ORDER — ROSUVASTATIN CALCIUM 20 MG PO TABS: 20.0000 mg | ORAL_TABLET | Freq: Every day | ORAL | 3 refills | Status: DC

## 2022-07-30 MED ORDER — MONTELUKAST SODIUM 10 MG PO TABS: 10.0000 mg | ORAL_TABLET | Freq: Every day | ORAL | 0 refills | Status: DC

## 2022-07-30 MED ORDER — AZELASTINE HCL 0.1 % NA SOLN: 2.0000 | Freq: Every day | NASAL | 3 refills | Status: DC | PRN

## 2022-07-31 ENCOUNTER — Encounter: Payer: Self-pay | Admitting: Family Medicine

## 2022-07-31 LAB — COMPREHENSIVE METABOLIC PANEL
ALT: 25 U/L (ref 0–53)
AST: 18 U/L (ref 0–37)
Albumin: 4.8 g/dL (ref 3.5–5.2)
Alkaline Phosphatase: 61 U/L (ref 39–117)
BUN: 17 mg/dL (ref 6–23)
CO2: 24 mEq/L (ref 19–32)
Calcium: 9.6 mg/dL (ref 8.4–10.5)
Chloride: 104 mEq/L (ref 96–112)
Creatinine, Ser: 0.76 mg/dL (ref 0.40–1.50)
GFR: 97.39 mL/min (ref >60.00)
Glucose, Bld: 94 mg/dL (ref 70–99)
Potassium: 4 mEq/L (ref 3.5–5.1)
Sodium: 139 mEq/L (ref 135–145)
Total Bilirubin: 0.4 mg/dL (ref 0.2–1.2)
Total Protein: 7.3 g/dL (ref 6.0–8.3)

## 2022-07-31 LAB — CBC
HCT: 40.9 % (ref 39.0–52.0)
Hemoglobin: 13.6 g/dL (ref 13.0–17.0)
MCHC: 33.4 g/dL (ref 30.0–36.0)
MCV: 89 fl (ref 78.0–100.0)
Platelets: 369 10*3/uL (ref 150.0–400.0)
RBC: 4.59 Mil/uL (ref 4.22–5.81)
RDW: 13.3 % (ref 11.5–15.5)
WBC: 8.1 10*3/uL (ref 4.0–10.5)

## 2022-07-31 LAB — LIPID PANEL
Cholesterol: 180 mg/dL (ref 0–200)
HDL: 50.9 mg/dL (ref >39.00)
LDL Cholesterol: 112 mg/dL — ABNORMAL HIGH (ref 0–99)
NonHDL: 128.98
Total CHOL/HDL Ratio: 4
Triglycerides: 83 mg/dL (ref 0.0–149.0)
VLDL: 16.6 mg/dL (ref 0.0–40.0)

## 2022-07-31 LAB — HEMOGLOBIN A1C: Hgb A1c MFr Bld: 6.2 % (ref 4.6–6.5)

## 2022-07-31 LAB — PSA: PSA: 0.47 ng/mL (ref 0.10–4.00)

## 2022-08-06 ENCOUNTER — Ambulatory Visit (HOSPITAL_BASED_OUTPATIENT_CLINIC_OR_DEPARTMENT_OTHER)
Admission: RE | Admit: 2022-08-06 | Discharge: 2022-08-06 | Disposition: A | Discharge status: Home / Self Care | Payer: BC Managed Care – PPO | Source: Ambulatory Visit | Attending: Family Medicine | Admitting: Family Medicine

## 2022-08-06 DIAGNOSIS — R7303 Prediabetes: Secondary | ICD-10-CM | POA: Insufficient documentation

## 2022-08-06 DIAGNOSIS — E785 Hyperlipidemia, unspecified: Secondary | ICD-10-CM | POA: Insufficient documentation

## 2022-08-07 ENCOUNTER — Encounter: Payer: Self-pay | Admitting: Family Medicine

## 2022-08-25 ENCOUNTER — Other Ambulatory Visit: Payer: Self-pay | Admitting: Family Medicine

## 2022-09-27 ENCOUNTER — Other Ambulatory Visit: Payer: Self-pay | Admitting: Family Medicine

## 2022-09-27 DIAGNOSIS — N401 Enlarged prostate with lower urinary tract symptoms: Secondary | ICD-10-CM

## 2022-10-14 ENCOUNTER — Other Ambulatory Visit: Payer: Self-pay | Admitting: Family Medicine

## 2022-10-25 ENCOUNTER — Other Ambulatory Visit: Payer: Self-pay | Admitting: Family Medicine

## 2022-10-25 DIAGNOSIS — G8929 Other chronic pain: Secondary | ICD-10-CM

## 2022-12-20 ENCOUNTER — Encounter: Payer: Self-pay | Admitting: Family Medicine

## 2023-02-16 ENCOUNTER — Other Ambulatory Visit: Payer: Self-pay | Admitting: Family Medicine

## 2023-02-16 DIAGNOSIS — G8929 Other chronic pain: Secondary | ICD-10-CM

## 2023-03-25 ENCOUNTER — Encounter (INDEPENDENT_AMBULATORY_CARE_PROVIDER_SITE_OTHER): Payer: BC Managed Care – PPO | Admitting: Family Medicine

## 2023-03-25 DIAGNOSIS — J329 Chronic sinusitis, unspecified: Secondary | ICD-10-CM | POA: Diagnosis not present

## 2023-03-25 MED ORDER — AMOXICILLIN 500 MG PO CAPS: 1000.0000 mg | ORAL_CAPSULE | Freq: Two times a day (BID) | ORAL | 0 refills | Status: DC

## 2023-03-25 NOTE — Telephone Encounter (Signed)

## 2023-03-25 NOTE — Telephone Encounter (Signed)
Would you advise an OV?

## 2023-03-25 NOTE — Addendum Note (Signed)
Addended by: Abbe Amsterdam C on: 03/25/2023 04:35 PM   Modules accepted: Orders

## 2023-04-17 ENCOUNTER — Other Ambulatory Visit: Payer: Self-pay | Admitting: Family Medicine

## 2023-04-20 ENCOUNTER — Other Ambulatory Visit: Payer: Self-pay | Admitting: Family Medicine

## 2023-04-20 DIAGNOSIS — N401 Enlarged prostate with lower urinary tract symptoms: Secondary | ICD-10-CM

## 2023-04-23 ENCOUNTER — Encounter: Payer: Self-pay | Admitting: Internal Medicine

## 2023-05-20 ENCOUNTER — Encounter: Payer: Self-pay | Admitting: Internal Medicine

## 2023-05-20 ENCOUNTER — Ambulatory Visit (AMBULATORY_SURGERY_CENTER): Payer: BC Managed Care – PPO | Admitting: *Deleted

## 2023-05-20 VITALS — Ht 68.0 in | Wt 195.0 lb

## 2023-05-20 DIAGNOSIS — Z1211 Encounter for screening for malignant neoplasm of colon: Secondary | ICD-10-CM

## 2023-05-20 MED ORDER — NA SULFATE-K SULFATE-MG SULF 17.5-3.13-1.6 GM/177ML PO SOLN: 1.0000 | Freq: Once | ORAL | 0 refills | Status: AC

## 2023-05-20 NOTE — Progress Notes (Signed)
No egg or soy allergy known to patient  No issues known to pt with past sedation with any surgeries or procedures Patient denies ever being told they had issues or difficulty with intubation  No FH of Malignant Hyperthermia Pt is not on diet pills Pt is not on  home 02  Pt is not on blood thinners  Pt denies issues with constipation  No A fib or A flutter Have any cardiac testing pending--NO Pt instructed to use Single[employer] or GoodRx for a price reduction on prep    DENIES TAKING PHENTERMINE,GLP1 ANTAGONIST  Patient's chart reviewed by Cathlyn Parsons CNRA prior to previsit and patient appropriate for the [facility].  Previsit completed and red dot placed by patient's name on their procedure day (on provider's schedule).

## 2023-05-27 ENCOUNTER — Ambulatory Visit: Payer: BC Managed Care – PPO | Admitting: Internal Medicine

## 2023-05-27 ENCOUNTER — Encounter: Payer: Self-pay | Admitting: Internal Medicine

## 2023-05-27 VITALS — BP 130/82 | HR 54 | Temp 97.9°F | Resp 15 | Ht 67.0 in | Wt 195.0 lb

## 2023-05-27 DIAGNOSIS — D123 Benign neoplasm of transverse colon: Secondary | ICD-10-CM

## 2023-05-27 DIAGNOSIS — Z1211 Encounter for screening for malignant neoplasm of colon: Secondary | ICD-10-CM

## 2023-05-27 DIAGNOSIS — D122 Benign neoplasm of ascending colon: Secondary | ICD-10-CM

## 2023-05-27 DIAGNOSIS — K635 Polyp of colon: Secondary | ICD-10-CM | POA: Diagnosis not present

## 2023-05-27 MED ORDER — SODIUM CHLORIDE 0.9 % IV SOLN: 500.0000 mL | INTRAVENOUS | Status: DC

## 2023-05-27 MED ORDER — SODIUM CHLORIDE 0.9 % IV SOLN: 500.0000 mL | Freq: Once | INTRAVENOUS | Status: DC

## 2023-05-27 NOTE — Progress Notes (Signed)
Called to room to assist during endoscopic procedure.  Patient ID and intended procedure confirmed with present staff. Received instructions for my participation in the procedure from the performing physician.  

## 2023-05-27 NOTE — Op Note (Signed)
[facility] Patient Name: Earl Ruiz Procedure Date: 05/27/2023 1:57 PM MRN: [id number] Endoscopist: Beverley Fiedler , MD, [id number] Age: 62 Referring MD:  Date of Birth: [birth year] Gender: Male Account #: [id number] Procedure:                Colonoscopy Indications:              Screening for colorectal malignant neoplasm, Last                            colonoscopy 10 years ago Medicines:                Monitored Anesthesia Care Procedure:                Pre-Anesthesia Assessment:                           - Prior to the procedure, a History and Physical                            was performed, and patient medications and                            allergies were reviewed. The patient's tolerance of                            previous anesthesia was also reviewed. The risks                            and benefits of the procedure and the sedation                            options and risks were discussed with the patient.                            All questions were answered, and informed consent                            was obtained. Prior Anticoagulants: The patient has                            taken no anticoagulant or antiplatelet agents. ASA                            Grade Assessment: II - A patient with mild systemic                            disease. After reviewing the risks and benefits,                            the patient was deemed in satisfactory condition to                            undergo the procedure.  After obtaining informed consent, the colonoscope                            was passed under direct vision. Throughout the                            procedure, the patient's blood pressure, pulse, and                            oxygen saturations were monitored continuously. The                            Olympus CF-HQ190L ([id number]) Colonoscope was                            introduced through the anus and advanced  to the                            terminal ileum. The colonoscopy was performed                            without difficulty. The patient tolerated the                            procedure well. The quality of the bowel                            preparation was good. The terminal ileum, ileocecal                            valve, appendiceal orifice, and rectum were                            photographed. Scope In: 2:02:27 PM Scope Out: 2:15:01 PM Scope Withdrawal Time: 0 hours 11 minutes 9 seconds  Total Procedure Duration: 0 hours 12 minutes 34 seconds  Findings:                 The digital rectal exam was normal.                           The terminal ileum appeared normal.                           A 4 mm polyp was found in the ascending colon. The                            polyp was sessile. The polyp was removed with a                            cold snare. Resection and retrieval were complete.                           A 3 mm polyp was found in the transverse colon. The  polyp was sessile. The polyp was removed with a                            cold snare. Resection and retrieval were complete.                           Multiple medium-mouthed and small-mouthed                            diverticula were found in the sigmoid colon and                            descending colon.                           The retroflexed view of the distal rectum and anal                            verge was normal and showed no anal or rectal                            abnormalities. Complications:            No immediate complications. Estimated Blood Loss:     Estimated blood loss: none. Impression:               - The examined portion of the ileum was normal.                           - One 4 mm polyp in the ascending colon, removed                            with a cold snare. Resected and retrieved.                           - One 3 mm polyp in the transverse  colon, removed                            with a cold snare. Resected and retrieved.                           - Mild diverticulosis in the sigmoid colon and in                            the descending colon.                           - The distal rectum and anal verge are normal on                            retroflexion view. Recommendation:           - Patient has a contact number available for  emergencies. The signs and symptoms of potential                            delayed complications were discussed with the                            patient. Return to normal activities tomorrow.                            Written discharge instructions were provided to the                            patient.                           - Resume previous diet.                           - Continue present medications.                           - Await pathology results.                           - Repeat colonoscopy is recommended. The                            colonoscopy date will be determined after pathology                            results from today's exam become available for                            review. Beverley Fiedler, MD 05/27/2023 2:20:03 PM This report has been signed electronically.

## 2023-05-27 NOTE — Progress Notes (Signed)
[facility]  Pt's states no medical or surgical changes since previsit or office visit.

## 2023-05-27 NOTE — Progress Notes (Signed)
Sedate, gd SR, tolerated procedure well, VSS, report to RN 

## 2023-05-27 NOTE — Progress Notes (Signed)
GASTROENTEROLOGY PROCEDURE H&P NOTE   Primary Care Physician: Pearline Cables, MD    Reason for Procedure:  Colon cancer screening  Plan:    Colonoscopy  Patient is appropriate for endoscopic procedure(s) in the ambulatory ([facility]) setting.  The nature of the procedure, as well as the risks, benefits, and alternatives were carefully and thoroughly reviewed with the patient. Ample time for discussion and questions allowed. The patient understood, was satisfied, and agreed to proceed.     HPI: Earl Ruiz is a 62 y.o. male who presents for colonoscopy.  Medical history as below.  Tolerated the prep.  No recent chest pain or shortness of breath.  No abdominal pain today.  Past Medical History:  Diagnosis Date   Allergy    Choking episode occurring at night 05/10/2014   Hyperlipidemia    Sleep apnea 05/10/2014   Snoring 05/10/2014    Past Surgical History:  Procedure Laterality Date   EXTRACORPOREAL SHOCK WAVE LITHOTRIPSY Left 03/28/2020   Procedure: EXTRACORPOREAL SHOCK WAVE LITHOTRIPSY (ESWL);  Surgeon: Jerilee Field, MD;  Location: [facility];  Service: Urology;  Laterality: Left;    Prior to Admission medications   Medication Sig Start Date End Date Taking? Authorizing Provider  azelastine (ASTELIN) 0.1 % nasal spray Place 2 sprays into both nostrils daily as needed for rhinitis. Use in each nostril as directed 07/30/22  Yes Copland, Gwenlyn Found, MD  montelukast (SINGULAIR) 10 MG tablet Take 1 tablet (10 mg total) by mouth at bedtime. 04/17/23  Yes Copland, Gwenlyn Found, MD  rosuvastatin (CRESTOR) 20 MG tablet Take 1 tablet (20 mg total) by mouth daily. 07/30/22  Yes Copland, Gwenlyn Found, MD  tamsulosin (FLOMAX) 0.4 MG CAPS capsule Take 1 capsule (0.4 mg total) by mouth daily. 04/22/23  Yes Copland, Gwenlyn Found, MD  albuterol (VENTOLIN HFA) 108 (90 Base) MCG/ACT inhaler Inhale 2 puffs into the lungs every 6 (six) hours as needed for wheezing or shortness of  breath. 05/08/21   Copland, Gwenlyn Found, MD  meloxicam (MOBIC) 15 MG tablet TAKE 1 TABLET BY MOUTH ONCE DAILY AS NEEDED FOR PAIN 02/18/23   Copland, Gwenlyn Found, MD    Current Outpatient Medications  Medication Sig Dispense Refill   azelastine (ASTELIN) 0.1 % nasal spray Place 2 sprays into both nostrils daily as needed for rhinitis. Use in each nostril as directed 90 mL 3   montelukast (SINGULAIR) 10 MG tablet Take 1 tablet (10 mg total) by mouth at bedtime. 90 tablet 0   rosuvastatin (CRESTOR) 20 MG tablet Take 1 tablet (20 mg total) by mouth daily. 90 tablet 3   tamsulosin (FLOMAX) 0.4 MG CAPS capsule Take 1 capsule (0.4 mg total) by mouth daily. 30 capsule 0   albuterol (VENTOLIN HFA) 108 (90 Base) MCG/ACT inhaler Inhale 2 puffs into the lungs every 6 (six) hours as needed for wheezing or shortness of breath. 18 g 5   meloxicam (MOBIC) 15 MG tablet TAKE 1 TABLET BY MOUTH ONCE DAILY AS NEEDED FOR PAIN 90 tablet 0   Current Facility-Administered Medications  Medication Dose Route Frequency Provider Last Rate Last Admin   0.9 %  sodium chloride infusion  500 mL Intravenous Continuous Jatasia Gundrum, Carie Caddy, MD        Allergies as of 05/27/2023 - Review Complete 05/27/2023  Allergen Reaction Noted   Bee pollen Hives 11/15/2021    Family History  Problem Relation Age of Onset   Cancer Father        Prostate  ca,that spread   Diabetes Father    Allergic rhinitis Sister        as a child   Colon cancer Neg Hx    Colon polyps Neg Hx    Crohn's disease Neg Hx    Esophageal cancer Neg Hx    Rectal cancer Neg Hx    Stomach cancer Neg Hx    Ulcerative colitis Neg Hx     Social History   Socioeconomic History   Marital status: Married    Spouse name: Not on file   Number of children: Not on file   Years of education: Not on file   Highest education level: Not on file  Occupational History   Not on file  Tobacco Use   Smoking status: Never    Passive exposure: Past   Smokeless tobacco:  Never  Vaping Use   Vaping status: Never Used  Substance and Sexual Activity   Alcohol use: Yes    Comment: occ beer   Drug use: No   Sexual activity: Yes  Other Topics Concern   Not on file  Social History Narrative   Right handed.     Caffeine 2-3 cups daily    Married, no kids.  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    Social Determinants of Health   Financial Resource Strain: Not on file  Food Insecurity: Not on file  Transportation Needs: Not on file  Physical Activity: Not on file  Stress: Not on file (05/20/2023)  Social Connections: Not on file  Intimate Partner Violence: Low Risk  (10/28/2019)   Received [facility]   Intimate Partner Violence    Insults You: Not on file    Threatens You: Not on file    Screams at [place]: Not on file    Physically Hurt: Not on file    Intimate Partner Violence Score: Not on file    Physical Exam: Vital signs in last 24 hours: @BP  (!) 155/93 (BP Location: Right Arm, Patient Position: Sitting, Cuff Size: Normal)   Pulse 69   Temp 97.9 F (36.6 C) (Temporal)   Ht 5\' 7"  (1.702 m)   Wt 195 lb (88.5 kg)   SpO2 99%   BMI 30.54 kg/m  GEN: NAD EYE: Sclerae anicteric ENT: MMM CV: Non-tachycardic Pulm: CTA b/l GI: Soft, NT/ND NEURO:  Alert & Oriented x 3   [name], MD [facility]  05/27/2023 1:56 PM

## 2023-05-27 NOTE — Patient Instructions (Signed)
Resume previous diet and medications. Awaiting pathology results. Repeat Colonoscopy date to be determined based on pathology results.  Handouts provided on Colon polyps and Diverticulosis.  YOU HAD AN ENDOSCOPIC PROCEDURE TODAY AT THE [facility]:   Refer to the procedure report that was given to you for any specific questions about what was found during the examination.  If the procedure report does not answer your questions, please call your gastroenterologist to clarify.  If you requested that your care partner not be given the details of your procedure findings, then the procedure report has been included in a sealed envelope for you to review at your convenience later.  YOU SHOULD EXPECT: Some feelings of bloating in the abdomen. Passage of more gas than usual.  Walking can help get rid of the air that was put into your GI tract during the procedure and reduce the bloating. If you had a lower endoscopy (such as a colonoscopy or flexible sigmoidoscopy) you may notice spotting of blood in your stool or on the toilet paper. If you underwent a bowel prep for your procedure, you may not have a normal bowel movement for a few days.  Please Note:  You might notice some irritation and congestion in your nose or some drainage.  This is from the oxygen used during your procedure.  There is no need for concern and it should clear up in a day or so.  SYMPTOMS TO REPORT IMMEDIATELY:  Following lower endoscopy (colonoscopy or flexible sigmoidoscopy):  Excessive amounts of blood in the stool  Significant tenderness or worsening of abdominal pains  Swelling of the abdomen that is new, acute  Fever of 100F or higher  For urgent or emergent issues, a gastroenterologist can be reached at any hour by calling [phone]. Do not use MyChart messaging for urgent concerns.    DIET:  We do recommend a small meal at first, but then you may proceed to your regular diet.  Drink plenty of fluids but  you should avoid alcoholic beverages for 24 hours.  ACTIVITY:  You should plan to take it easy for the rest of today and you should NOT DRIVE or use heavy machinery until tomorrow (because of the sedation medicines used during the test).    FOLLOW UP: Our staff will call the number listed on your records the next business day following your procedure.  We will call around 7:15- 8:00 am to check on you and address any questions or concerns that you may have regarding the information given to you following your procedure. If we do not reach you, we will leave a message.     If any biopsies were taken you will be contacted by phone or by letter within the next 1-3 weeks.  Please call us at [phone] if you have not heard about the biopsies in 3 weeks.    SIGNATURES/CONFIDENTIALITY: You and/or your care partner have signed paperwork which will be entered into your electronic medical record.  These signatures attest to the fact that that the information above on your After Visit Summary has been reviewed and is understood.  Full responsibility of the confidentiality of this discharge information lies with you and/or your care-partner. 

## 2023-05-28 ENCOUNTER — Telehealth: Payer: Self-pay

## 2023-05-28 NOTE — Telephone Encounter (Signed)
  Follow up Call-     05/27/2023    1:15 PM 05/27/2023    1:05 PM  Call back number  Post procedure Call Back phone  # [phone]   Permission to leave phone message  Yes     Patient questions:  Do you have a fever, pain , or abdominal swelling? No. Pain Score  0 *  Have you tolerated food without any problems? Yes.    Have you been able to return to your normal activities? Yes.    Do you have any questions about your discharge instructions: Diet   No. Medications  No. Follow up visit  No.  Do you have questions or concerns about your Care? No.  Actions: * If pain score is 4 or above: No action needed, pain <4.

## 2023-05-28 NOTE — Progress Notes (Deleted)
[facility] [address], [address] [place], [place] [address] [phone]  Date:  05/30/2023   Name:  Earl Ruiz   DOB:  1961-01-26   MRN:  [id number]  PCP:  Pearline Cables, MD    Chief Complaint: No chief complaint on file.   History of Present Illness:  Earl Ruiz is a 62 y.o. very pleasant male patient who presents with the following:  Patient seen today with concern of back pain Most recent visit with myself was in January for his physical exam History of prediabetes, sleep apnea, dyslipidemia  Flu vaccine COVID-19 booster  Patient Active Problem List   Diagnosis Date Noted   Mild obstructive sleep apnea-hypopnea syndrome 11/15/2021   Sinusitis chronic, ethmoidal 09/11/2021   Shortness of breath 09/11/2021   Other allergic rhinitis 09/11/2021   Pre-diabetes 08/25/2015   Choking episode occurring at night 05/10/2014   Snoring 05/10/2014   Sleep apnea 05/10/2014    Past Medical History:  Diagnosis Date   Allergy    Choking episode occurring at night 05/10/2014   Hyperlipidemia    Sleep apnea 05/10/2014   Snoring 05/10/2014    Past Surgical History:  Procedure Laterality Date   EXTRACORPOREAL SHOCK WAVE LITHOTRIPSY Left 03/28/2020   Procedure: EXTRACORPOREAL SHOCK WAVE LITHOTRIPSY (ESWL);  Surgeon: Jerilee Field, MD;  Location: [facility];  Service: Urology;  Laterality: Left;    Social History   Tobacco Use   Smoking status: Never    Passive exposure: Past   Smokeless tobacco: Never  Vaping Use   Vaping status: Never Used  Substance Use Topics   Alcohol use: Yes    Comment: occ beer   Drug use: No    Family History  Problem Relation Age of Onset   Cancer Father        Prostate ca,that spread   Diabetes Father    Allergic rhinitis Sister        as a child   Colon cancer Neg Hx    Colon polyps Neg Hx    Crohn's disease Neg Hx    Esophageal cancer Neg Hx     Rectal cancer Neg Hx    Stomach cancer Neg Hx    Ulcerative colitis Neg Hx     Allergies  Allergen Reactions   Bee Pollen Hives    Medication list has been reviewed and updated.  Current Outpatient Medications on File Prior to Visit  Medication Sig Dispense Refill   albuterol (VENTOLIN HFA) 108 (90 Base) MCG/ACT inhaler Inhale 2 puffs into the lungs every 6 (six) hours as needed for wheezing or shortness of breath. 18 g 5   azelastine (ASTELIN) 0.1 % nasal spray Place 2 sprays into both nostrils daily as needed for rhinitis. Use in each nostril as directed 90 mL 3   meloxicam (MOBIC) 15 MG tablet TAKE 1 TABLET BY MOUTH ONCE DAILY AS NEEDED FOR PAIN 90 tablet 0   montelukast (SINGULAIR) 10 MG tablet Take 1 tablet (10 mg total) by mouth at bedtime. 90 tablet 0   rosuvastatin (CRESTOR) 20 MG tablet Take 1 tablet (20 mg total) by mouth daily. 90 tablet 3   tamsulosin (FLOMAX) 0.4 MG CAPS capsule Take 1 capsule (0.4 mg total) by mouth daily. 30 capsule 0   No current facility-administered medications on file prior to visit.    Review of Systems:  As per HPI- otherwise negative.   Physical Examination: There were  no vitals filed for this visit. There were no vitals filed for this visit. There is no height or weight on file to calculate BMI. Ideal Body Weight:    GEN: no acute distress. HEENT: Atraumatic, Normocephalic.  Ears and Nose: No external deformity. CV: RRR, No M/G/R. No JVD. No thrill. No extra heart sounds. PULM: CTA B, no wheezes, crackles, rhonchi. No retractions. No resp. distress. No accessory muscle use. ABD: S, NT, ND, +BS. No rebound. No HSM. EXTR: No c/c/e PSYCH: Normally interactive. Conversant.    Assessment and Plan: ***  Signed Abbe Amsterdam, MD

## 2023-05-30 ENCOUNTER — Ambulatory Visit: Payer: BC Managed Care – PPO | Admitting: Family Medicine

## 2023-05-30 LAB — SURGICAL PATHOLOGY

## 2023-06-03 ENCOUNTER — Encounter: Payer: Self-pay | Admitting: Internal Medicine

## 2023-07-24 ENCOUNTER — Other Ambulatory Visit: Payer: Self-pay | Admitting: Family Medicine

## 2023-07-24 DIAGNOSIS — N401 Enlarged prostate with lower urinary tract symptoms: Secondary | ICD-10-CM

## 2023-08-05 ENCOUNTER — Other Ambulatory Visit: Payer: Self-pay | Admitting: Family Medicine

## 2023-08-05 DIAGNOSIS — N401 Enlarged prostate with lower urinary tract symptoms: Secondary | ICD-10-CM

## 2023-08-06 ENCOUNTER — Other Ambulatory Visit: Payer: Self-pay | Admitting: Family Medicine

## 2023-08-06 DIAGNOSIS — J3089 Other allergic rhinitis: Secondary | ICD-10-CM

## 2023-08-06 DIAGNOSIS — E785 Hyperlipidemia, unspecified: Secondary | ICD-10-CM

## 2023-08-20 ENCOUNTER — Other Ambulatory Visit: Payer: Self-pay | Admitting: Family Medicine

## 2023-08-20 DIAGNOSIS — N401 Enlarged prostate with lower urinary tract symptoms: Secondary | ICD-10-CM

## 2023-08-26 ENCOUNTER — Other Ambulatory Visit: Payer: Self-pay

## 2023-08-27 ENCOUNTER — Other Ambulatory Visit: Payer: Self-pay

## 2023-09-09 ENCOUNTER — Other Ambulatory Visit: Payer: Self-pay | Admitting: Family Medicine

## 2023-09-09 DIAGNOSIS — E785 Hyperlipidemia, unspecified: Secondary | ICD-10-CM
